# Patient Record
Sex: Male | Born: 1955 | Race: White | Hispanic: No | Smoking: Current every day smoker
Health system: Southern US, Community
[De-identification: ages and names within clinical notes are randomized; demographics above are authoritative.]

## PROBLEM LIST (undated history)

## (undated) DIAGNOSIS — R911 Solitary pulmonary nodule: Secondary | ICD-10-CM

## (undated) DIAGNOSIS — I714 Abdominal aortic aneurysm, without rupture, unspecified: Secondary | ICD-10-CM

## (undated) DIAGNOSIS — I10 Essential (primary) hypertension: Secondary | ICD-10-CM

## (undated) DIAGNOSIS — J439 Emphysema, unspecified: Secondary | ICD-10-CM

## (undated) DIAGNOSIS — G44009 Cluster headache syndrome, unspecified, not intractable: Secondary | ICD-10-CM

## (undated) HISTORY — DX: Solitary pulmonary nodule: R91.1

## (undated) HISTORY — PX: TONSILLECTOMY: SUR1361

## (undated) HISTORY — PX: HERNIA REPAIR: SHX51

## (undated) HISTORY — DX: Essential (primary) hypertension: I10

---

## 2010-03-21 ENCOUNTER — Ambulatory Visit: Payer: Self-pay | Admitting: Internal Medicine

## 2012-05-08 ENCOUNTER — Encounter (HOSPITAL_COMMUNITY): Payer: Self-pay | Admitting: Emergency Medicine

## 2012-05-08 ENCOUNTER — Emergency Department (HOSPITAL_COMMUNITY)
Admission: EM | Admit: 2012-05-08 | Discharge: 2012-05-08 | Disposition: A | Discharge status: Home / Self Care | Payer: Self-pay | Attending: Emergency Medicine | Admitting: Emergency Medicine

## 2012-05-08 ENCOUNTER — Emergency Department (HOSPITAL_COMMUNITY): Payer: Self-pay

## 2012-05-08 DIAGNOSIS — R109 Unspecified abdominal pain: Secondary | ICD-10-CM | POA: Insufficient documentation

## 2012-05-08 DIAGNOSIS — I714 Abdominal aortic aneurysm, without rupture: Secondary | ICD-10-CM | POA: Insufficient documentation

## 2012-05-08 DIAGNOSIS — I719 Aortic aneurysm of unspecified site, without rupture: Secondary | ICD-10-CM | POA: Insufficient documentation

## 2012-05-08 HISTORY — DX: Abdominal aortic aneurysm, without rupture: I71.4

## 2012-05-08 HISTORY — DX: Abdominal aortic aneurysm, without rupture, unspecified: I71.40

## 2012-05-08 LAB — COMPREHENSIVE METABOLIC PANEL
AST: 20 U/L (ref 0–37)
Albumin: 3.3 g/dL — ABNORMAL LOW (ref 3.5–5.2)
Alkaline Phosphatase: 92 U/L (ref 39–117)
BUN: 20 mg/dL (ref 6–23)
Chloride: 107 mEq/L (ref 96–112)
Potassium: 4.1 mEq/L (ref 3.5–5.1)
Total Bilirubin: 0.5 mg/dL (ref 0.3–1.2)
Total Protein: 6.8 g/dL (ref 6.0–8.3)

## 2012-05-08 LAB — CBC
HCT: 51 % (ref 39.0–52.0)
MCHC: 35.7 g/dL (ref 30.0–36.0)
Platelets: 179 10*3/uL (ref 150–400)
RDW: 13.8 % (ref 11.5–15.5)
WBC: 11.1 10*3/uL — ABNORMAL HIGH (ref 4.0–10.5)

## 2012-05-08 LAB — URINALYSIS, ROUTINE W REFLEX MICROSCOPIC
Glucose, UA: NEGATIVE mg/dL
Hgb urine dipstick: NEGATIVE
Ketones, ur: NEGATIVE mg/dL
Leukocytes, UA: NEGATIVE
pH: 6 (ref 5.0–8.0)

## 2012-05-08 MED ORDER — IOHEXOL 350 MG/ML SOLN
100.0000 mL | Freq: Once | INTRAVENOUS | Status: AC | PRN
  Administered 2012-05-08: 100 mL via INTRAVENOUS

## 2012-05-08 NOTE — ED Notes (Signed)
Pt.bloodpressure 142/104.report to nurse Shanda Bumps rn.

## 2012-05-08 NOTE — Discharge Instructions (Signed)
Abdominal Aortic Aneurysm  An aneurysm is the enlargement (dilatation), bulging, or ballooning out of part of the wall of a vein or artery. An aortic aneurysm is a bulging in the largest artery of the body. This artery supplies blood from the heart to the rest of the body.  The first part of the aorta is called the thoracic aorta. It leaves the heart, rises (ascends), arches, and goes down (descends) through the chest until it reaches the diaphragm. The diaphragm is the muscular part between the chest and abdomen.   The second part of the aorta is called the abdominal aorta after it has passed the diaphragm and continues down through the abdomen. The abdominal aorta ends where it splits to form the two iliac arteries that go to the legs.  Aortic aneurysms can develop anywhere along the length of the aorta. The majority are located along the abdominal aorta. The major concern with an aortic aneurysm is that it can enlarge and rupture. This can cause death unless diagnosed and treated promptly. Aneurysms can also develop blood clots or infections. CAUSES  Many aortic aneurysms are caused by arteriosclerosis. Arteriosclerosis can weaken the aortic wall. The pressure of the blood being pumped through the aorta causes it to balloon out at the site of weakness. Therefore, high blood pressure (hypertension) is associated with aneurysm. Other risk factors include:  Age over 13.   Tobacco use.   Being male.   White race.   Family history of aneurysm.   Less frequent causes of abdominal aortic aneurysms include:   Connective tissue diseases.   Abdominal trauma.   Inflammation of blood vessles (arteritis).   Inherited (congenital) malformations.   Infection.  SYMPTOMS  The signs and symptoms of an unruptured aneurysm will partly depend on its size and rate of growth.   Abdominal aortic aneurysms may cause pain. The pain typically has a deep quality as if it is piercing into the person. It is  felt most often in the lower back area. The pain is usually steady but may be relieved by changing your body position.   The person may also become aware of an abnormally prominent pulse in the belly (abdominal pulsation).  DIAGNOSIS  An aortic aneurysm may be discovered by chance on physical exam, or on X-ray studies done for other reasons. It may be suspected because of other problems such as back or abdominal pain. The following tests may help identify the problem.  X-rays of the abdomen can show calcium deposits in the aneurysm wall.   CT scanning of the abdomen, particularly with contrast medium, is accurate at showing the exact size and shape of the aneurysm.   Ultrasounds give a clear picture of the size of an aneurysm (about 98% accuracy).   MRI scanning is accurate, but often unnecessary.   An abdominal angiogram shows the source of the major blood vessels arising from the aorta. It reveals the size and extent of any aneurysm. It can also show a clot clinging to the wall of the aneurysm (mural thrombus).  TREATMENT  Treating an abdominal aortic aneurysm depends on the size. A rupture of an aneurysm is uncommon when they are less than 5 cm wide (2 inches). Rupture is far more common in aneurysms that are over 6 cm wide (2.4 inches).  Surgical repair is usually recommended for all aneurysms over 6 cm wide (2.4 inches). This depends on the health, age, and other circumstances of the individual. This type of surgery consists of  opening the abdomen, removing the aneurysm, and sewing a synthetic graft (similar to a cloth tube) in its place. A less invasive form of this surgery, using stent grafts, is sometimes recommended.   For most patients, elective repair is recommended for aneurysms between 4 and 6 cm (1.6 and 2.4 inches). Elective means the surgery can be done at your convenience. This should not be put off too long if surgery is recommended.   If you smoke, stop immediately. Smoking  is a major risk factor for enlargement and rupture.   Medications may be used to help decrease complications - these include medicine to lower blood pressure and control cholesterol.  HOME CARE INSTRUCTIONS   If you smoke, stop. Do not start smoking.   Take all medications as prescribed.   Your caregiver will tell you when to have your aneurysm rechecked, either by ultrasound or CT scan.   If your caregiver has given you a follow-up appointment, it is very important to keep that appointment. Not keeping the appointment could result in a chronic or permanent injury, pain, or disability. If there is any problem keeping the appointment, you must call back to this facility for assistance.  SEEK MEDICAL CARE IF:   You develop mild abdominal pain or pressure.   You are able to feel or perceive your aneurysm, and you sense any change.  SEEK IMMEDIATE MEDICAL CARE IF:   You develop severe abdominal pain, or severe pain moving (radiating) to your back.   You suddenly develop cold or blue toes or feet.   You suddenly develop lightheadedness or fainting spells.  MAKE SURE YOU:   Understand these instructions.   Will watch your condition.   Will get help right away if you are not doing well or get worse.  Document Released: 08/30/2005 Document Revised: 11/09/2011 Document Reviewed: 06/23/2008 [facility] Patient Information 2012 [place], [place].  RESOURCE GUIDE  Dental Problems  Patients with Medicaid: [facility] [id number]                                           [phone] [address] Phone:  [phone]                                                   Phone:  [phone]  If unable to pay or uninsured, contact:  [facility] or [facility]. to become qualified for the adult dental clinic.  Chronic Pain Problems Contact [facility]  [phone] Patients need to be referred by their primary care  doctor.  Insufficient Money for Medicine Contact [employer] Way:  call "211" or [facility] [phone].  No Primary Care Doctor Call [facility]  [phone] Other agencies that provide inexpensive medical care    [facility]  [phone]    [facility]  [phone]    [facility]  [phone]    [facility]  [phone]    [facility]  [phone]    [facility]  [phone]  Psychological Services [facility]  [phone] [facility]  Services  [phone] [facility]   [phone] (emergency services [phone])  Abuse/Neglect [facility] Child Abuse Hotline [phone] [facility] Child Abuse Hotline 3164897323 (After Hours)  Emergency Shelter [facility] [phone]  [facility] [place] [facility] [phone] [facility] [phone]  MRSA Hotline #:   812-212-6036    [facility] Resources  [facility] [place]  [employer]                           [facility] [address] [place]                     [address]         [address]  Blondell Reveal Phone:  [phone]                                  Phone:  [phone]                   Phone:  [phone]  [facility] Phone:  [phone]  [facility] Child Abuse Hotline [phone] (After Hours)

## 2012-05-08 NOTE — ED Notes (Signed)
Pt c/o intermittent lower abd pain x 3 weeks; pt sts has hx of abdominal aneurysm pt sts is homeless at present

## 2012-05-08 NOTE — ED Provider Notes (Signed)
History     CSN: [id number]  Arrival date & time 05/08/12  0903   None     Chief Complaint  Patient presents with  . Abdominal Pain    (Consider location/radiation/quality/duration/timing/severity/associated sxs/prior treatment) HPI  56yoM h/o AAA pw abdominal pain. Patient c/o intermittent b/l lower abd pain x 2 weeks, worse yesterday. Describes as sharp and crampy, 5/10. He had fleeting lower back pain this morning, resolved. Denies f/c/n/v/c/d. Denies hematuria/dysuria/freq/urgency. H/o 3.5cm AAA last checked 1 year ago OSH. Concerned about rupture. Does not have PMD. Homeless at this time. Denies other complaints. Declining pain medication at this time. No h/o abdominal surgeries  H/o R inguinal hernia repair as child   Past Medical History  Diagnosis Date  . Abdominal aneurysm     History reviewed. No pertinent past surgical history.  History reviewed. No pertinent family history.  History  Substance Use Topics  . Smoking status: Current Everyday Smoker  . Smokeless tobacco: Not on file  . Alcohol Use: No      Review of Systems  All other systems reviewed and are negative.   except as noted HPI   Allergies  Review of patient's allergies indicates no known allergies.  Home Medications  No current outpatient prescriptions on file.  BP 124/96  Pulse 62  Temp(Src) 97.5 F (36.4 C) (Oral)  Resp 18  SpO2 95%  Physical Exam  Nursing note and vitals reviewed. Constitutional: He is oriented to person, place, and time. He appears well-developed and well-nourished. No distress.  HENT:  Head: Atraumatic.  Mouth/Throat: Oropharynx is clear and moist.  Eyes: Conjunctivae are normal. Pupils are equal, round, and reactive to light.  Neck: Neck supple.  Cardiovascular: Normal rate, regular rhythm, normal heart sounds and intact distal pulses.  Exam reveals no gallop and no friction rub.   No murmur heard. Pulmonary/Chest: Effort normal. No respiratory  distress. He has no wheezes. He has no rales.  Abdominal: Soft. Bowel sounds are normal. There is no tenderness. There is no rebound and no guarding.  Musculoskeletal: Normal range of motion. He exhibits no edema and no tenderness.       Femoral pulses strong and equal b/l  Neurological: He is alert and oriented to person, place, and time.  Skin: Skin is warm and dry.  Psychiatric: He has a normal mood and affect.    ED Course  Procedures (including critical care time)  Labs Reviewed  CBC - Abnormal; Notable for the following:    WBC 11.1 (*)    Hemoglobin 18.2 (*)    All other components within normal limits  COMPREHENSIVE METABOLIC PANEL - Abnormal; Notable for the following:    Albumin 3.3 (*)    All other components within normal limits  URINALYSIS, ROUTINE W REFLEX MICROSCOPIC   Ct Angio Abd/pel W/ And/or W/o  05/08/2012  *RADIOLOGY REPORT*  Clinical Data:  No focal abdominal pain.  History of abdominal aortic aneurysm.  CT ANGIOGRAPHY ABDOMEN AND PELVIS  Technique:  Multidetector CT imaging of the abdomen and pelvis was performed using the standard protocol during bolus administration of intravenous contrast.  Multiplanar reconstructed images including MIPs were obtained and reviewed to evaluate the vascular anatomy.  Contrast: OMNIPAQUE IOHEXOL 350 MG/ML SOLN  Comparison:   None.  Findings:  There is mild diffuse atherosclerosis of the abdominal aorta and iliac arteries.  The infrarenal abdominal aorta has a maximal AP diameter of 3.4 cm.  There is mild associated mural thrombus.  There is  no evidence of dissection or retroperitoneal hemorrhage.  The celiac trunk, superior and inferior mesenteric arteries are patent.  There are single widely patent renal arteries bilaterally.  The lung bases are clear.  There is no pleural effusion.  The liver, gallbladder, biliary system and pancreas appear normal.  The spleen and adrenal glands appear normal.  The bowel gas pattern appears  normal.  No inflammatory changes or enlarged lymph nodes are identified. The appendix appears normal. The bladder and prostate gland appear normal.  No acute osseous findings are seen.   Review of the MIP images confirms the above findings.  IMPRESSION:  1.  Aortic atherosclerosis with small infrarenal abdominal aortic aneurysm.  No evidence of rupture or retroperitoneal hemorrhage. 2.  No acute abdominal pelvic findings.  Original Report Authenticated By: Gerrianne Scale, M.D.    1. Abdominal pain   2. Aortic aneurysm     MDM  Nonspecific abdominal pain, none currently. Labs and U/A unremarkable. CT angio without dissection Aneurysm at 3.4cm. Stable for discharge home. Given resources for PMD f/u. Precautions for return.  BP 124/96  Pulse 62  Temp(Src) 97.5 F (36.4 C) (Oral)  Resp 18  SpO2 95%         Forbes Cellar, MD 05/08/12 1307

## 2012-06-19 ENCOUNTER — Ambulatory Visit: Payer: Self-pay | Admitting: Family Medicine

## 2012-09-26 ENCOUNTER — Emergency Department: Payer: Self-pay | Admitting: Emergency Medicine

## 2012-09-26 LAB — URINALYSIS, COMPLETE
Glucose,UR: NEGATIVE mg/dL (ref 0–75)
Ketone: NEGATIVE
Nitrite: NEGATIVE
Ph: 5 (ref 4.5–8.0)
Protein: NEGATIVE
RBC,UR: 3 /HPF (ref 0–5)
Specific Gravity: 1.024 (ref 1.003–1.030)
Squamous Epithelial: NONE SEEN
WBC UR: <1 /HPF (ref 0–5)

## 2012-09-26 LAB — CBC
HCT: 52.1 % — ABNORMAL HIGH (ref 40.0–52.0)
MCH: 31.5 pg (ref 26.0–34.0)
MCV: 93 fL (ref 80–100)
RBC: 5.59 10*6/uL (ref 4.40–5.90)

## 2012-09-26 LAB — BASIC METABOLIC PANEL
Anion Gap: 8 (ref 7–16)
Calcium, Total: 8.5 mg/dL (ref 8.5–10.1)
Chloride: 111 mmol/L — ABNORMAL HIGH (ref 98–107)
Co2: 22 mmol/L (ref 21–32)
EGFR (African American): >60
Glucose: 88 mg/dL (ref 65–99)
Osmolality: 283 (ref 275–301)
Sodium: 141 mmol/L (ref 136–145)

## 2013-06-15 ENCOUNTER — Emergency Department (HOSPITAL_COMMUNITY): Payer: Self-pay

## 2013-06-15 ENCOUNTER — Encounter (HOSPITAL_COMMUNITY): Payer: Self-pay | Admitting: Emergency Medicine

## 2013-06-15 ENCOUNTER — Emergency Department (HOSPITAL_COMMUNITY)
Admission: EM | Admit: 2013-06-15 | Discharge: 2013-06-15 | Disposition: A | Discharge status: Home / Self Care | Payer: Self-pay | Attending: Emergency Medicine | Admitting: Emergency Medicine

## 2013-06-15 DIAGNOSIS — Z79899 Other long term (current) drug therapy: Secondary | ICD-10-CM | POA: Insufficient documentation

## 2013-06-15 DIAGNOSIS — R000 Tachycardia, unspecified: Secondary | ICD-10-CM | POA: Insufficient documentation

## 2013-06-15 DIAGNOSIS — R0989 Other specified symptoms and signs involving the circulatory and respiratory systems: Secondary | ICD-10-CM | POA: Insufficient documentation

## 2013-06-15 DIAGNOSIS — Z8679 Personal history of other diseases of the circulatory system: Secondary | ICD-10-CM | POA: Insufficient documentation

## 2013-06-15 DIAGNOSIS — R0789 Other chest pain: Secondary | ICD-10-CM | POA: Insufficient documentation

## 2013-06-15 DIAGNOSIS — F17200 Nicotine dependence, unspecified, uncomplicated: Secondary | ICD-10-CM | POA: Insufficient documentation

## 2013-06-15 DIAGNOSIS — R0609 Other forms of dyspnea: Secondary | ICD-10-CM | POA: Insufficient documentation

## 2013-06-15 DIAGNOSIS — Z5900 Homelessness unspecified: Secondary | ICD-10-CM | POA: Insufficient documentation

## 2013-06-15 DIAGNOSIS — R05 Cough: Secondary | ICD-10-CM | POA: Insufficient documentation

## 2013-06-15 DIAGNOSIS — R0600 Dyspnea, unspecified: Secondary | ICD-10-CM

## 2013-06-15 DIAGNOSIS — R059 Cough, unspecified: Secondary | ICD-10-CM | POA: Insufficient documentation

## 2013-06-15 LAB — CBC WITH DIFFERENTIAL/PLATELET
Eosinophils Relative: 2 % (ref 0–5)
HCT: 51.4 % (ref 39.0–52.0)
Hemoglobin: 18 g/dL — ABNORMAL HIGH (ref 13.0–17.0)
Lymphocytes Relative: 21 % (ref 12–46)
MCV: 89.7 fL (ref 78.0–100.0)
Monocytes Absolute: 1.4 10*3/uL — ABNORMAL HIGH (ref 0.1–1.0)
Monocytes Relative: 10 % (ref 3–12)
Neutro Abs: 9 10*3/uL — ABNORMAL HIGH (ref 1.7–7.7)
WBC: 13.5 10*3/uL — ABNORMAL HIGH (ref 4.0–10.5)

## 2013-06-15 LAB — COMPREHENSIVE METABOLIC PANEL
ALT: 15 U/L (ref 0–53)
AST: 20 U/L (ref 0–37)
Albumin: 3.4 g/dL — ABNORMAL LOW (ref 3.5–5.2)
Alkaline Phosphatase: 86 U/L (ref 39–117)
BUN: 19 mg/dL (ref 6–23)
CO2: 21 mEq/L (ref 19–32)
Calcium: 9.5 mg/dL (ref 8.4–10.5)
Chloride: 105 mEq/L (ref 96–112)
Creatinine, Ser: 0.73 mg/dL (ref 0.50–1.35)
Glucose, Bld: 79 mg/dL (ref 70–99)
Total Protein: 7.4 g/dL (ref 6.0–8.3)

## 2013-06-15 LAB — POCT I-STAT TROPONIN I

## 2013-06-15 MED ORDER — PREDNISONE 20 MG PO TABS
60.0000 mg | ORAL_TABLET | ORAL | Status: AC
  Administered 2013-06-15: 60 mg via ORAL
  Filled 2013-06-15: qty 3

## 2013-06-15 MED ORDER — ALBUTEROL SULFATE HFA 108 (90 BASE) MCG/ACT IN AERS
1.0000 | INHALATION_SPRAY | RESPIRATORY_TRACT | Status: DC
  Administered 2013-06-15: 2 via RESPIRATORY_TRACT
  Filled 2013-06-15: qty 6.7

## 2013-06-15 MED ORDER — PREDNISONE 20 MG PO TABS: 40.0000 mg | ORAL_TABLET | Freq: Every day | ORAL | Status: AC

## 2013-06-15 MED ORDER — IOHEXOL 350 MG/ML SOLN
100.0000 mL | Freq: Once | INTRAVENOUS | Status: AC | PRN
  Administered 2013-06-15: 100 mL via INTRAVENOUS

## 2013-06-15 NOTE — ED Provider Notes (Signed)
History    CSN: [id number] Arrival date & time 06/15/13  1246  First MD Initiated Contact with Patient 06/15/13 1503     Chief Complaint  Patient presents with  . Shortness of Breath   (Consider location/radiation/quality/duration/timing/severity/associated sxs/prior Treatment) HPI  Patient presents with concern of increasing dyspnea, lightheadedness. Symptoms have been minimally present until the last 3 days, when they have been increasingly severe. Patient now has exertional dyspnea, persistent dyspnea at rest, and associated diffuse anterior chest discomfort with inspiration. Patient also complains of exertional lightheadedness. He denies chest pain at rest. Patient denies fever, endorses cough. Patient has a long smoking history, was previously told he has an abnormal x-ray. He is homeless, has no access to primary care.   Past Medical History  Diagnosis Date  . Abdominal aneurysm    History reviewed. No pertinent past surgical history. No family history on file. History  Substance Use Topics  . Smoking status: Current Every Day Smoker  . Smokeless tobacco: Not on file  . Alcohol Use: No    Review of Systems  Constitutional:       Per HPI, otherwise negative  HENT:       Per HPI, otherwise negative  Respiratory:       Per HPI, otherwise negative  Cardiovascular:       Per HPI, otherwise negative  Gastrointestinal: Negative for vomiting.  Endocrine:       Negative aside from HPI  Genitourinary:       Neg aside from HPI   Musculoskeletal:       Per HPI, otherwise negative  Skin: Negative.   Neurological: Negative for syncope.    Allergies  Review of patient's allergies indicates no known allergies.  Home Medications   Current Outpatient Rx  Name  Route  Sig  Dispense  Refill  . lisinopril (PRINIVIL,ZESTRIL) 10 MG tablet   Oral   Take 10 mg by mouth daily.          BP 157/93  Pulse 120  Temp(Src) 98.3 F (36.8 C)  Resp 20  SpO2  95% Physical Exam  Nursing note and vitals reviewed. Constitutional: He is oriented to person, place, and time. He appears well-developed. No distress.  HENT:  Head: Normocephalic and atraumatic.  Eyes: Conjunctivae and EOM are normal.  Cardiovascular: Regular rhythm.  Tachycardia present.   Pulmonary/Chest: Effort normal. No stridor. No respiratory distress. He has decreased breath sounds.  Abdominal: He exhibits no distension.  Musculoskeletal: He exhibits no edema.  Neurological: He is alert and oriented to person, place, and time.  Skin: Skin is warm and dry.  Psychiatric: He has a normal mood and affect.    ED Course  Procedures (including critical care time) Labs Reviewed  CBC WITH DIFFERENTIAL  COMPREHENSIVE METABOLIC PANEL  PRO B NATRIURETIC PEPTIDE   Dg Chest 2 View  06/15/2013   *RADIOLOGY REPORT*  Clinical Data: Shortness of breath.  Lightheadedness.  COPD.  CHEST - 2 VIEW  Comparison: None.  Findings: Linear opacity is seen in the lingula, which may be due to atelectasis or scarring.  No evidence of pulmonary air space disease or edema.  No evidence of pleural effusion.  Heart size is normal.  No mass or lymphadenopathy identified.  IMPRESSION: Lingular atelectasis versus scarring.   Original Report Authenticated By: Myles Rosenthal, M.D.   No diagnosis found.  His initial chest x-ray has opacification in the lingula, and given the patient's tachycardia, his dyspnea, CT scan was performed.  Pulse ox 99% with nasal cannula abnormal Cardiac 110 sinus tach abnormal   Date: 06/15/2013  Rate: 111  Rhythm: sinus tachycardia  QRS Axis: left  Intervals: normal  ST/T Wave abnormalities: nonspecific ST/T changes  Conduction Disutrbances:left anterior fascicular block  Narrative Interpretation:   Old EKG Reviewed: none available ABNORMAL  7:44 PM In no distress - HD stable.  MDM  Patient with a strong smoking history now presents with worsening dyspnea, including  exertional discomfort and pleuritic discomfort.  Patient's evaluation here included CT scan, and there is no evidence of pneumonia, but there is suspicion of chronic lung disease.  The patient is homeless, and was provided resources to obtain assistance with health care, as well as referral to pulmonary medicine.   Gerhard Munch, MD 06/15/13 1946

## 2013-06-15 NOTE — ED Notes (Signed)
Pt from home c/o SOB, lightheaded. Pt states that he was told approx 4 mths ago that he had suspicious chest x ray. Pt is a current smoker. Pt has productive cough with tan sputum. Pt denies CP or any pain, just discomfort with breathing. Pt is A&O and in NAD. Pt adds that walking or physical exertion makes breathing worse. Pt O2 sats 97% on RA.

## 2013-06-20 ENCOUNTER — Ambulatory Visit (INDEPENDENT_AMBULATORY_CARE_PROVIDER_SITE_OTHER): Payer: Self-pay | Admitting: Internal Medicine

## 2013-06-20 ENCOUNTER — Encounter: Payer: Self-pay | Admitting: Internal Medicine

## 2013-06-20 VITALS — BP 130/80 | HR 72 | Temp 98.2°F | Ht 71.0 in | Wt 248.0 lb

## 2013-06-20 DIAGNOSIS — R059 Cough, unspecified: Secondary | ICD-10-CM

## 2013-06-20 DIAGNOSIS — R05 Cough: Secondary | ICD-10-CM

## 2013-06-20 DIAGNOSIS — R0989 Other specified symptoms and signs involving the circulatory and respiratory systems: Secondary | ICD-10-CM

## 2013-06-20 DIAGNOSIS — R0600 Dyspnea, unspecified: Secondary | ICD-10-CM

## 2013-06-20 DIAGNOSIS — R0609 Other forms of dyspnea: Secondary | ICD-10-CM

## 2013-06-20 DIAGNOSIS — I10 Essential (primary) hypertension: Secondary | ICD-10-CM

## 2013-06-20 DIAGNOSIS — F17200 Nicotine dependence, unspecified, uncomplicated: Secondary | ICD-10-CM

## 2013-06-20 MED ORDER — OLMESARTAN MEDOXOMIL-HCTZ 20-12.5 MG PO TABS: ORAL_TABLET | ORAL | Status: DC

## 2013-06-20 NOTE — Patient Instructions (Addendum)
Stop lisinopril  Start benicar 20/12.5  mg one half daily   The key is to stop smoking completely before smoking completely stops you!   Only use your albuterol as a rescue medication to be used if you can't catch your breath by resting or doing a relaxed purse lip breathing pattern. The less you use it, the better it will work when you need it. Ok to use  up to 2puffs every 4 hours if needed   If you are satisfied with your treatment plan let your doctor know and he/she can either refill your medications or you can return here when your prescription runs out.     If in any way you are not 100% satisfied,  please tell us.  If 100% better, tell your friends!

## 2013-06-20 NOTE — Progress Notes (Signed)
  Subjective:    Patient ID: Cameron Armstrong, male    DOB: [birth year]  MRN: [id number]  HPI  37 yowm uninsured homeless  Smoker dx'd copd in [place] 2011 rx albuterol and rec stop smoking with variable sob since that point referred to pulmonary clinic 06/20/2013 p er eval for sob   06/20/2013 1st pulmonary eval/ Jona Zappone/ still smoking on ACEi cc sob daily last time breathing was good was sev months prior to OV  In [place] and not on ACEi at that time but at [facility]/co assoc cough > mid prod mucoid sputum cough worse lying down some better on prednisone and some better on albuterol and still sob on inclines.   Not much better on albuterol.  No obvious daytime variabilty or assoc chronic cough or cp or chest tightness, subjective wheeze overt sinus or hb symptoms. No unusual exp hx or h/o childhood pna/ asthma or knowledge of premature birth.      Review of Systems  Constitutional: Negative for fever, chills, activity change, appetite change and unexpected weight change.  HENT: Negative for congestion, sore throat, rhinorrhea, sneezing, trouble swallowing, dental problem, voice change and postnasal drip.   Eyes: Negative for visual disturbance.  Respiratory: Positive for cough and shortness of breath. Negative for choking.   Cardiovascular: Negative for chest pain and leg swelling.  Gastrointestinal: Negative for nausea, vomiting and abdominal pain.  Genitourinary: Negative for difficulty urinating.  Musculoskeletal: Negative for arthralgias.  Skin: Positive for rash.  Psychiatric/Behavioral: Negative for behavioral problems and confusion.       Objective:   Physical Exam  amb slt hoarse wm nad Wt Readings from Last 3 Encounters:  06/20/13 248 lb (112.492 kg)      .HEENT: nl dentition, turbinates, and orophanx. Nl external ear canals without cough reflex   NECK :  without JVD/Nodes/TM/ nl carotid upstrokes bilaterally   LUNGS: no acc muscle use, clear to A and P bilaterally without cough on  insp or exp maneuvers   CV:  RRR  no s3 or murmur or increase in P2, no edema   ABD:  soft and nontender with nl excursion in the supine position. No bruits or organomegaly, bowel sounds nl  MS:  warm without deformities, calf tenderness, cyanosis or clubbing  SKIN: warm and dry without lesions    NEURO:  alert, approp, no deficits      Ct 06/15/13 . Negative for pulmonary embolism.  2. Emphysema, which may account for mild hilar lymph node  enlargement       Assessment & Plan:

## 2013-06-21 DIAGNOSIS — I10 Essential (primary) hypertension: Secondary | ICD-10-CM | POA: Insufficient documentation

## 2013-06-21 DIAGNOSIS — F17200 Nicotine dependence, unspecified, uncomplicated: Secondary | ICD-10-CM | POA: Insufficient documentation

## 2013-06-21 DIAGNOSIS — R059 Cough, unspecified: Secondary | ICD-10-CM | POA: Insufficient documentation

## 2013-06-21 DIAGNOSIS — R05 Cough: Secondary | ICD-10-CM | POA: Insufficient documentation

## 2013-06-21 NOTE — Assessment & Plan Note (Signed)
I reviewed the Flethcher curve with patient that basically indicates  if you quit smoking when your best day FEV1 is still well preserved (which is clearly is)  it is highly unlikely you will progress to severe disease and informed the patient there was no medication on the market that has proven to change the curve or the likelihood of progression.  Therefore stopping smoking and maintaining abstinence is the most important aspect of care, not choice of inhalers or for that matter, doctors.    Pulmonary f/u is prn

## 2013-06-21 NOTE — Assessment & Plan Note (Signed)
Changed to benicar 20/12.5 one half daily x 3 month samples as he has no funds or insurance.

## 2013-06-21 NOTE — Assessment & Plan Note (Signed)
Despite smoking hx the dx is not copd but rather  Classic Upper airway cough syndrome, so named because it's frequently impossible to sort out how much is  CR/sinusitis with freq throat clearing (which can be related to primary GERD)   vs  causing  secondary (" extra esophageal")  GERD from wide swings in gastric pressure that occur with throat clearing, often  promoting self use of mint and menthol lozenges that reduce the lower esophageal sphincter tone and exacerbate the problem further in a cyclical fashion.   These are the same pts (now being labeled as having "irritable larynx syndrome" by some cough centers) who not infrequently have a history of having failed to tolerate ace inhibitors,  dry powder inhalers or biphosphonates or report having atypical reflux symptoms that don't respond to standard doses of PPI , and are easily confused as having aecopd or asthma flares by even experienced allergists/ pulmonologists.   For now try off acei and regroup prn  Smoking cessation also strongly rec

## 2014-02-16 ENCOUNTER — Encounter (HOSPITAL_COMMUNITY): Payer: Self-pay | Admitting: Emergency Medicine

## 2014-02-16 ENCOUNTER — Emergency Department (HOSPITAL_COMMUNITY): Payer: Self-pay

## 2014-02-16 ENCOUNTER — Emergency Department (HOSPITAL_COMMUNITY)
Admission: EM | Admit: 2014-02-16 | Discharge: 2014-02-16 | Disposition: A | Discharge status: Home / Self Care | Payer: Self-pay | Attending: Emergency Medicine | Admitting: Emergency Medicine

## 2014-02-16 DIAGNOSIS — F17200 Nicotine dependence, unspecified, uncomplicated: Secondary | ICD-10-CM | POA: Insufficient documentation

## 2014-02-16 DIAGNOSIS — Z7982 Long term (current) use of aspirin: Secondary | ICD-10-CM | POA: Insufficient documentation

## 2014-02-16 DIAGNOSIS — R519 Headache, unspecified: Secondary | ICD-10-CM

## 2014-02-16 DIAGNOSIS — R51 Headache: Secondary | ICD-10-CM | POA: Insufficient documentation

## 2014-02-16 DIAGNOSIS — I10 Essential (primary) hypertension: Secondary | ICD-10-CM | POA: Insufficient documentation

## 2014-02-16 MED ORDER — KETOROLAC TROMETHAMINE 30 MG/ML IJ SOLN
30.0000 mg | Freq: Once | INTRAMUSCULAR | Status: AC
  Administered 2014-02-16: 30 mg via INTRAVENOUS
  Filled 2014-02-16: qty 1

## 2014-02-16 MED ORDER — PROCHLORPERAZINE EDISYLATE 5 MG/ML IJ SOLN: 10.0000 mg | Freq: Four times a day (QID) | INTRAMUSCULAR | Status: DC | PRN

## 2014-02-16 MED ORDER — DIPHENHYDRAMINE HCL 50 MG/ML IJ SOLN
25.0000 mg | Freq: Once | INTRAMUSCULAR | Status: AC
  Administered 2014-02-16: 25 mg via INTRAVENOUS
  Filled 2014-02-16: qty 1

## 2014-02-16 MED ORDER — SODIUM CHLORIDE 0.9 % IV BOLUS (SEPSIS)
1000.0000 mL | Freq: Once | INTRAVENOUS | Status: AC
  Administered 2014-02-16: 1000 mL via INTRAVENOUS

## 2014-02-16 MED ORDER — LORAZEPAM 1 MG PO TABS
1.0000 mg | ORAL_TABLET | Freq: Once | ORAL | Status: AC
  Administered 2014-02-16: 1 mg via ORAL
  Filled 2014-02-16: qty 1

## 2014-02-16 MED ORDER — PROCHLORPERAZINE EDISYLATE 5 MG/ML IJ SOLN
10.0000 mg | Freq: Once | INTRAMUSCULAR | Status: AC
  Administered 2014-02-16: 10 mg via INTRAVENOUS
  Filled 2014-02-16: qty 2

## 2014-02-16 NOTE — ED Notes (Signed)
Pt presents to ed with c/o headache for weeks. Sts since yesterday it has become unbearable. PT sts years ago he was told he has a brain bleed but never checked it out? (Sts doctor's told him he needs to have camera put in his brain to look at it because it can't been seen on CT scan)  Pt denies n/v.

## 2014-02-16 NOTE — Discharge Instructions (Signed)
Return here as needed.  Follow-up with the neurologist provided °

## 2014-02-16 NOTE — Progress Notes (Signed)
[facility] provided pt with a list of primary care resources, highlighting [facility]. Provided patient with a list of shelters in [place].

## 2014-02-16 NOTE — ED Provider Notes (Signed)
CSN: [id number]     Arrival date & time 02/16/14  1252 History   First MD Initiated Contact with Patient 02/16/14 1505     Chief Complaint  Patient presents with  . Headache     (Consider location/radiation/quality/duration/timing/severity/associated sxs/prior Treatment) HPI Patient presents to the emergency department with headache, intermittently for the last week.  The patient, states, that previous headaches, that are similar to this in the past.  Patient, states, that he is told that he has cluster migraines.  Patient, states he never followed up on these headaches.  The patient, states, that they will come intermittently with a searing pain behind his eyes.  Patient denies blurred vision, weakness, dizziness, neck pain, nausea, vomiting, diarrhea, chest pain, shortness of breath, or syncope.  Patient, states, that nothing seems to help his headache.  Past Medical History  Diagnosis Date  . Abdominal aneurysm   . Pulmonary nodule   . Hypertension    History reviewed. No pertinent past surgical history. Family History  Problem Relation Age of Onset  . Heart attack Father    History  Substance Use Topics  . Smoking status: Current Every Day Smoker -- 1.50 packs/day for 37 years    Types: Cigarettes  . Smokeless tobacco: Never Used  . Alcohol Use: No    Review of Systems All other systems negative except as documented in the HPI. All pertinent positives and negatives as reviewed in the HPI.   Allergies  Review of patient's allergies indicates no known allergies.  Home Medications   Current Outpatient Rx  Name  Route  Sig  Dispense  Refill  . acetaminophen (TYLENOL) 325 MG tablet   Oral   Take 650 mg by mouth every 6 (six) hours as needed for headache.         Marland Kitchen aspirin 325 MG EC tablet   Oral   Take 1,300 mg by mouth as needed for pain (headache).         . diphenhydrAMINE (BENADRYL) 25 mg capsule   Oral   Take 25 mg by mouth every 6 (six) hours as needed for  allergies.         . naproxen sodium (ANAPROX) 220 MG tablet   Oral   Take 440-660 mg by mouth as needed (headache).          BP 138/86  Pulse 78  Temp(Src) 97.9 F (36.6 C) (Oral)  Resp 18  SpO2 95% Physical Exam  Nursing note and vitals reviewed. Constitutional: He is oriented to person, place, and time. He appears well-developed and well-nourished. No distress.  HENT:  Head: Normocephalic and atraumatic.  Eyes: EOM are normal. Pupils are equal, round, and reactive to light.  Neck: Normal range of motion. Neck supple.  Cardiovascular: Normal rate, regular rhythm and normal heart sounds.  Exam reveals no gallop and no friction rub.   No murmur heard. Pulmonary/Chest: Effort normal and breath sounds normal. No respiratory distress.  Neurological: He is alert and oriented to person, place, and time. He exhibits normal muscle tone. Coordination normal.  Skin: Skin is warm and dry.    ED Course  Procedures (including critical care time) Imaging Review Ct Head Wo Contrast  02/16/2014   CLINICAL DATA:  Headaches  EXAM: CT HEAD WITHOUT CONTRAST  TECHNIQUE: Contiguous axial images were obtained from the base of the skull through the vertex without intravenous contrast.  COMPARISON:  None.  FINDINGS: There is no evidence of mass effect, midline shift or extra-axial fluid  collections. There is no evidence of a space-occupying lesion or intracranial hemorrhage. There is no evidence of a cortical-based area of acute infarction.  The ventricles and sulci are appropriate for the patient's age. The basal cisterns are patent.  Visualized portions of the orbits are unremarkable. The visualized portions of the paranasal sinuses and mastoid air cells are unremarkable.  The osseous structures are unremarkable.  IMPRESSION: Normal CT of the brain.   Electronically Signed   By: Elige KoHetal  Patel   On: 02/16/2014 16:14     Patient will be referred to neurology.  Told to return here as needed.  Also  advised the patient to increase his fluid intake.  CT scan was reviewed and is negative  Carlyle DollyChristopher W Juni Glaab, PA-C 02/16/14 1827

## 2014-02-16 NOTE — ED Notes (Signed)
Patient transported to CT 

## 2014-02-17 NOTE — ED Provider Notes (Signed)
Medical screening examination/treatment/procedure(s) were performed by non-physician practitioner and as supervising physician I was immediately available for consultation/collaboration.   EKG Interpretation None       Gilda Creasehristopher J. Pollina, MD 02/17/14 [phone]

## 2014-02-24 ENCOUNTER — Ambulatory Visit: Payer: Self-pay | Admitting: Neurology

## 2014-03-11 ENCOUNTER — Ambulatory Visit: Payer: Self-pay | Admitting: Neurology

## 2014-03-27 ENCOUNTER — Ambulatory Visit: Payer: Self-pay | Admitting: Neurology

## 2014-05-10 ENCOUNTER — Emergency Department (HOSPITAL_COMMUNITY)
Admission: EM | Admit: 2014-05-10 | Discharge: 2014-05-10 | Disposition: A | Discharge status: Home / Self Care | Payer: Self-pay | Attending: Emergency Medicine | Admitting: Emergency Medicine

## 2014-05-10 ENCOUNTER — Encounter (HOSPITAL_COMMUNITY): Payer: Self-pay | Admitting: Emergency Medicine

## 2014-05-10 DIAGNOSIS — Z23 Encounter for immunization: Secondary | ICD-10-CM | POA: Insufficient documentation

## 2014-05-10 DIAGNOSIS — L02619 Cutaneous abscess of unspecified foot: Secondary | ICD-10-CM | POA: Insufficient documentation

## 2014-05-10 DIAGNOSIS — Z79899 Other long term (current) drug therapy: Secondary | ICD-10-CM | POA: Insufficient documentation

## 2014-05-10 DIAGNOSIS — F17200 Nicotine dependence, unspecified, uncomplicated: Secondary | ICD-10-CM | POA: Insufficient documentation

## 2014-05-10 DIAGNOSIS — J4489 Other specified chronic obstructive pulmonary disease: Secondary | ICD-10-CM | POA: Insufficient documentation

## 2014-05-10 DIAGNOSIS — I10 Essential (primary) hypertension: Secondary | ICD-10-CM | POA: Insufficient documentation

## 2014-05-10 DIAGNOSIS — L600 Ingrowing nail: Secondary | ICD-10-CM | POA: Insufficient documentation

## 2014-05-10 DIAGNOSIS — J449 Chronic obstructive pulmonary disease, unspecified: Secondary | ICD-10-CM | POA: Insufficient documentation

## 2014-05-10 DIAGNOSIS — L03039 Cellulitis of unspecified toe: Secondary | ICD-10-CM | POA: Insufficient documentation

## 2014-05-10 MED ORDER — SULFAMETHOXAZOLE-TRIMETHOPRIM 800-160 MG PO TABS: 1.0000 | ORAL_TABLET | Freq: Two times a day (BID) | ORAL | Status: AC

## 2014-05-10 MED ORDER — TETANUS-DIPHTH-ACELL PERTUSSIS 5-2.5-18.5 LF-MCG/0.5 IM SUSP
0.5000 mL | Freq: Once | INTRAMUSCULAR | Status: AC
  Administered 2014-05-10: 0.5 mL via INTRAMUSCULAR
  Filled 2014-05-10: qty 0.5

## 2014-05-10 MED ORDER — SULFAMETHOXAZOLE-TMP DS 800-160 MG PO TABS
1.0000 | ORAL_TABLET | Freq: Once | ORAL | Status: AC
  Administered 2014-05-10: 1 via ORAL
  Filled 2014-05-10: qty 1

## 2014-05-10 NOTE — ED Notes (Signed)
Pt. reports left great toe pain for 3 weeks , denies injury .

## 2014-05-10 NOTE — ED Notes (Signed)
Pt desatted to 87%. When this RN entered the pt's room, she found that he was sleeping. This RN placed the pt on 2L of O2 as a precaution.

## 2014-05-10 NOTE — Discharge Instructions (Signed)
Infected Ingrown Toenail  An infected ingrown toenail occurs when the nail edge grows into the skin and bacteria invade the area. Symptoms include pain, tenderness, swelling, and pus drainage from the edge of the nail. Poorly fitting shoes, minor injuries, and improper cutting of the toenail may also contribute to the problem. You should cut your toenails squarely instead of rounding the edges. Do not cut them too short. Avoid tight or pointed toe shoes. Sometimes the ingrown portion of the nail must be removed. If your toenail is removed, it can take 3-4 months for it to re-grow.  HOME CARE INSTRUCTIONS    Soak your infected toe in warm water for 20-30 minutes, 2 to 3 times a day.   Packing or dressings applied to the area should be changed daily.   Take medicine as directed and finish them.   Reduce activities and keep your foot elevated when able to reduce swelling and discomfort. Do this until the infection gets better.   Wear sandals or go barefoot as much as possible while the infected area is sensitive.   See your caregiver for follow-up care in 2-3 days if the infection is not better.  SEEK MEDICAL CARE IF:   Your toe is becoming more red, swollen or painful.  MAKE SURE YOU:    Understand these instructions.   Will watch your condition.   Will get help right away if you are not doing well or get worse.  Document Released: 12/28/2004 Document Revised: 02/12/2012 Document Reviewed: 11/16/2008  [employer] Patient Information 2014 [employer].

## 2014-05-10 NOTE — ED Provider Notes (Addendum)
CSN: [id number]     Arrival date & time 05/10/14  0006 History   First MD Initiated Contact with Patient 05/10/14 0209     Chief Complaint  Patient presents with  . Toe Pain     (Consider location/radiation/quality/duration/timing/severity/associated sxs/prior Treatment) HPI 58 yo man with HTN, PVD and COPD. Here with complaints of left great toe pain. Patient does not cut his toe nails. He has developed an ingrown toenail over lateral aspect of large toe. Has mild ttp and mild throbbing pain which is non radiating. No fever. No proximal pain. Nothing makes pain worse. Last td is unknown.   Past Medical History  Diagnosis Date  . Abdominal aneurysm   . Pulmonary nodule   . Hypertension    History reviewed. No pertinent past surgical history. Family History  Problem Relation Age of Onset  . Heart attack Father    History  Substance Use Topics  . Smoking status: Current Every Day Smoker -- 1.50 packs/day for 37 years    Types: Cigarettes  . Smokeless tobacco: Never Used  . Alcohol Use: No    Review of Systems  Ten point review of symptoms performed and is negative with the exception of symptoms noted above.    Allergies  Review of patient's allergies indicates no known allergies.  Home Medications   Prior to Admission medications   Medication Sig Start Date End Date Taking? Authorizing Provider  amLODipine (NORVASC) 10 MG tablet Take 10 mg by mouth daily.   Yes Historical Provider, MD  cloNIDine (CATAPRES) 0.1 MG tablet Take 0.1 mg by mouth daily.   Yes Historical Provider, MD  hydrochlorothiazide (HYDRODIURIL) 25 MG tablet Take 25 mg by mouth daily.   Yes Historical Provider, MD  simvastatin (ZOCOR) 20 MG tablet Take 20 mg by mouth daily.   Yes Historical Provider, MD   BP 106/72  Pulse 76  Temp(Src) 97.6 F (36.4 C) (Oral)  Resp 25  Ht 5\' 11"  (1.803 m)  Wt 234 lb (106.142 kg)  BMI 32.65 kg/m2  SpO2 88% Physical Exam Gen: well developed and well nourished  appearing Head: NCAT Eyes: PERL, EOMI Nose: no epistaixis or rhinorrhea Mouth/throat: mucosa is moist and pink, poor dentition Lungs: CTA B, no wheezing, rhonchi or rales CV: RRR, no murmur, extremities appear well perfused.  Abd:  nondistended Skin: warm and dry Ext: normal to inspection, no dependent edema, very thick and long toenails x 10, left great toe nail is overgrown, has curled back and and caused abrasion of the skin lateral to the nail bed, there is some mild erythema, mild ttp, no proximal streaking, No swelling of digit.  Neuro: CN ii-xii grossly intact, no focal deficits Psyche; normal affect,  calm and cooperative.   ED Course  Procedures (including critical care time) Labs Review  MDM   Ingrown toenail.  Patient needs nail care from a podiatrist. We will refer to Dr. Alita Chyle.  Will update Td and prescribe course of Bactrim.  Patient to see Podiatry tomorrow. Counseled re: return precautions.     Brandt Loosen, MD 05/10/14 5885  Brandt Loosen, MD 05/24/14 [phone]

## 2014-12-31 ENCOUNTER — Emergency Department (HOSPITAL_COMMUNITY)
Admission: EM | Admit: 2014-12-31 | Discharge: 2014-12-31 | Disposition: A | Discharge status: Home / Self Care | Payer: Self-pay | Attending: Emergency Medicine | Admitting: Emergency Medicine

## 2014-12-31 ENCOUNTER — Encounter (HOSPITAL_COMMUNITY): Payer: Self-pay

## 2014-12-31 DIAGNOSIS — I714 Abdominal aortic aneurysm, without rupture, unspecified: Secondary | ICD-10-CM

## 2014-12-31 DIAGNOSIS — Z720 Tobacco use: Secondary | ICD-10-CM | POA: Insufficient documentation

## 2014-12-31 DIAGNOSIS — Z590 Homelessness: Secondary | ICD-10-CM | POA: Insufficient documentation

## 2014-12-31 DIAGNOSIS — Z79899 Other long term (current) drug therapy: Secondary | ICD-10-CM | POA: Insufficient documentation

## 2014-12-31 DIAGNOSIS — I10 Essential (primary) hypertension: Secondary | ICD-10-CM | POA: Insufficient documentation

## 2014-12-31 LAB — BASIC METABOLIC PANEL
Anion gap: 9 (ref 5–15)
BUN: 15 mg/dL (ref 6–23)
CO2: 24 mmol/L (ref 19–32)
CREATININE: 1.03 mg/dL (ref 0.50–1.35)
Calcium: 9.1 mg/dL (ref 8.4–10.5)
Chloride: 106 mmol/L (ref 96–112)
GFR calc Af Amer: >90 mL/min (ref >90)
GFR calc non Af Amer: 79 mL/min — ABNORMAL LOW (ref >90)
Glucose, Bld: 85 mg/dL (ref 70–99)
POTASSIUM: 3.7 mmol/L (ref 3.5–5.1)
Sodium: 139 mmol/L (ref 135–145)

## 2014-12-31 LAB — CBC
HEMATOCRIT: 50.6 % (ref 39.0–52.0)
Hemoglobin: 17.9 g/dL — ABNORMAL HIGH (ref 13.0–17.0)
MCH: 31.9 pg (ref 26.0–34.0)
MCHC: 35.4 g/dL (ref 30.0–36.0)
MCV: 90 fL (ref 78.0–100.0)
Platelets: 198 10*3/uL (ref 150–400)
RBC: 5.62 MIL/uL (ref 4.22–5.81)
RDW: 13.7 % (ref 11.5–15.5)
WBC: 13.6 10*3/uL — ABNORMAL HIGH (ref 4.0–10.5)

## 2014-12-31 NOTE — Discharge Instructions (Signed)
[facility].   You need to get repeat US in a year to monitor aneurysm.   Return to ER if you have severe abdominal pain, fever, vomiting.

## 2014-12-31 NOTE — ED Provider Notes (Signed)
CSN: [id number]     Arrival date & time 12/31/14  2051 History   First MD Initiated Contact with Patient 12/31/14 2210     Chief Complaint  Patient presents with  . Aneurysm     (Consider location/radiation/quality/duration/timing/severity/associated sxs/prior Treatment) The history is provided by the patient.  Cameron Armstrong is a 59 y.o. male hx of aneurysm, HTN here with here with checkup for aortic aneurysm. He is currently homeless and hasn't been following up with his aortic aneurysm for the last 2 years. About 2 years ago he states that his aneurysm is about 3.7 cm. He has been having intermittent abdominal pain that is going on for the last month. Denies any fever or chills or vomiting. Denies any urinary symptoms. He is currently homeless and just wants to get checked out.    Past Medical History  Diagnosis Date  . Abdominal aneurysm   . Pulmonary nodule   . Hypertension    History reviewed. No pertinent past surgical history. Family History  Problem Relation Age of Onset  . Heart attack Father    History  Substance Use Topics  . Smoking status: Current Every Day Smoker -- 1.50 packs/day for 37 years    Types: Cigarettes  . Smokeless tobacco: Never Used  . Alcohol Use: No    Review of Systems  Gastrointestinal: Positive for abdominal pain.  All other systems reviewed and are negative.     Allergies  Review of patient's allergies indicates no known allergies.  Home Medications   Prior to Admission medications   Medication Sig Start Date End Date Taking? Authorizing Provider  amLODipine (NORVASC) 10 MG tablet Take 10 mg by mouth daily.    Historical Provider, MD  cloNIDine (CATAPRES) 0.1 MG tablet Take 0.1 mg by mouth daily.    Historical Provider, MD  hydrochlorothiazide (HYDRODIURIL) 25 MG tablet Take 25 mg by mouth daily.    Historical Provider, MD  simvastatin (ZOCOR) 20 MG tablet Take 20 mg by mouth daily.    Historical Provider, MD   BP 129/87 mmHg   Pulse 91  Temp(Src) 97.9 F (36.6 C) (Oral)  Resp 22  SpO2 95% Physical Exam  Constitutional: He is oriented to person, place, and time. He appears well-developed and well-nourished.  HENT:  Head: Normocephalic.  Mouth/Throat: Oropharynx is clear and moist.  Eyes: Pupils are equal, round, and reactive to light.  Neck: Normal range of motion. Neck supple.  Cardiovascular: Normal rate, regular rhythm and normal heart sounds.   Pulmonary/Chest: Effort normal and breath sounds normal. No respiratory distress. He has no wheezes. He has no rales.  Abdominal: Soft. Bowel sounds are normal. He exhibits no distension. There is no tenderness. There is no rebound and no guarding.  Musculoskeletal: Normal range of motion. He exhibits no edema or tenderness.  Neurological: He is alert and oriented to person, place, and time. No cranial nerve deficit. Coordination normal.  Skin: Skin is warm and dry.  Psychiatric: He has a normal mood and affect. His behavior is normal. Judgment and thought content normal.  Nursing note and vitals reviewed.   ED Course  Procedures (including critical care time)  EMERGENCY DEPARTMENT [place] ABD/AORTA EXAM Study: Limited Ultrasound of the Abdominal Aorta.  INDICATIONS:Abdominal pain Indication: Multiple views of the abdominal aorta are obtained from the diaphragmatic hiatus to the aortic bifurcation in transverse and sagittal planes with a multi- Frequency probe.  PERFORMED BY: Myself  IMAGES ARCHIVED?: Yes  FINDINGS: Maximum aortic dimensions are 3.7  cm   LIMITATIONS:  Body habitus  INTERPRETATION:  Stable AAA   COMMENT:     Labs Review Labs Reviewed  CBC - Abnormal; Notable for the following:    WBC 13.6 (*)    Hemoglobin 17.9 (*)    All other components within normal limits  BASIC METABOLIC PANEL - Abnormal; Notable for the following:    GFR calc non Af Amer 79 (*)    All other components within normal limits    Imaging Review No results  found.   EKG Interpretation   Date/Time:  Thursday December 31 2014 20:58:30 EST Ventricular Rate:  99 PR Interval:  180 QRS Duration: 92 QT Interval:  388 QTC Calculation: 497 R Axis:   -54 Text Interpretation:  Normal sinus rhythm Left anterior fascicular block  Anterolateral infarct , age undetermined Abnormal ECG No significant  change since last tracing Confirmed by YAO  MD, DAVID ([id number]) on 12/31/2014  10:11:02 PM      MDM   Final diagnoses:  None    Cameron Armstrong is a 59 y.o. male here with vague abdominal pain for the last month. Bedside US showed AAA 3.7, that is stable from previous. Vitals stable. Abdomen nontender. WBC 13 but was 13 2 years ago. No signs of infection. No signs of AAA rupture. I told him to find PMD and get regular follow up in a year. Return if he has vomiting, fever, severe abdominal pain.     Richardean Canalavid H Yao, MD 12/31/14 [phone]

## 2014-12-31 NOTE — ED Notes (Signed)
Pt here for checkup on abdominal aneurysm.  Sts that it has been 2 years since he last got it checked out due to not having a doctor here.  Pt reporting no pain or shortness of breath.

## 2015-11-03 ENCOUNTER — Ambulatory Visit: Payer: Self-pay | Attending: Family Medicine

## 2015-12-15 ENCOUNTER — Emergency Department (HOSPITAL_COMMUNITY)
Admission: EM | Admit: 2015-12-15 | Discharge: 2015-12-16 | Disposition: A | Discharge status: Home / Self Care | Payer: Self-pay | Attending: Emergency Medicine | Admitting: Emergency Medicine

## 2015-12-15 ENCOUNTER — Encounter (HOSPITAL_COMMUNITY): Payer: Self-pay | Admitting: Emergency Medicine

## 2015-12-15 DIAGNOSIS — I10 Essential (primary) hypertension: Secondary | ICD-10-CM | POA: Insufficient documentation

## 2015-12-15 DIAGNOSIS — R519 Headache, unspecified: Secondary | ICD-10-CM

## 2015-12-15 DIAGNOSIS — R51 Headache: Secondary | ICD-10-CM | POA: Insufficient documentation

## 2015-12-15 DIAGNOSIS — J439 Emphysema, unspecified: Secondary | ICD-10-CM | POA: Insufficient documentation

## 2015-12-15 DIAGNOSIS — Z79899 Other long term (current) drug therapy: Secondary | ICD-10-CM | POA: Insufficient documentation

## 2015-12-15 DIAGNOSIS — F17210 Nicotine dependence, cigarettes, uncomplicated: Secondary | ICD-10-CM | POA: Insufficient documentation

## 2015-12-15 HISTORY — DX: Cluster headache syndrome, unspecified, not intractable: G44.009

## 2015-12-15 HISTORY — DX: Emphysema, unspecified: J43.9

## 2015-12-15 NOTE — ED Notes (Signed)
Pt states he has a hx of cluster migraines  Pt states he has pain across the front of his forehead  Pt states the pain started around 4am this morning  Pt states the pain fluctuates in intensity  Pt states it is like a inner burning pain  Pt states he has taken aspirin and has taken benadryl 3 separate times today  Pt states he is very tired

## 2015-12-16 MED ORDER — KETOROLAC TROMETHAMINE 60 MG/2ML IM SOLN
60.0000 mg | Freq: Once | INTRAMUSCULAR | Status: AC
  Administered 2015-12-16: 60 mg via INTRAMUSCULAR
  Filled 2015-12-16: qty 2

## 2015-12-16 MED ORDER — PROCHLORPERAZINE EDISYLATE 5 MG/ML IJ SOLN
10.0000 mg | Freq: Once | INTRAMUSCULAR | Status: AC
  Administered 2015-12-16: 10 mg via INTRAMUSCULAR
  Filled 2015-12-16: qty 2

## 2015-12-16 NOTE — Discharge Instructions (Signed)
Follow-up with your primary care physician. °Return here for any new or worsening symptoms. °

## 2015-12-16 NOTE — ED Provider Notes (Signed)
CSN: [id number]     Arrival date & time 12/15/15  2047 History   First MD Initiated Contact with Patient 12/16/15 0255     Chief Complaint  Patient presents with  . Headache     (Consider location/radiation/quality/duration/timing/severity/associated sxs/prior Treatment) Patient is a 60 y.o. male presenting with headaches. The history is provided by the patient and medical records.  Headache   59 y.o. M with hx of AAA, pulmonary nodules, HTN, cluster headaches, emphysema, presenting to the ED for headache.  Patient states headache began 4 days ago, has been progressively worsening.  Patient states headache occurs across his forehead, throbbing in nature. He denies any photophobia, phonophobia, dizziness, aura, visual disturbance, changes in speech, numbness, or weakness. No difficulty ambulating. Patient does have history of cluster headaches, states this is similar but less severe than some of his headaches in the past. He has taken aspirin and Benadryl 3 times a day without relief. He states now he just feels sleepy from all of Benadryl. He denies any fever, chills, or neck pain.  Patient not currently on anti-coagulation.  No recent head injury or falls.  Patient states he has not seen a neurologist for his headaches in several years. PCP-- Mee Hives  Past Medical History  Diagnosis Date  . Abdominal aneurysm (HCC)   . Pulmonary nodule   . Hypertension   . Migraine-cluster headache syndrome   . Emphysema of lung [facility])    Past Surgical History  Procedure Laterality Date  . Hernia repair    . Tonsillectomy     Family History  Problem Relation Age of Onset  . Heart attack Father    Social History  Substance Use Topics  . Smoking status: Current Every Day Smoker -- 1.50 packs/day for 37 years    Types: Cigarettes  . Smokeless tobacco: Never Used  . Alcohol Use: No    Review of Systems  Neurological: Positive for headaches.  All other systems reviewed and are  negative.     Allergies  Review of patient's allergies indicates no known allergies.  Home Medications   Prior to Admission medications   Medication Sig Start Date End Date Taking? Authorizing Provider  albuterol (PROVENTIL HFA;VENTOLIN HFA) 108 (90 Base) MCG/ACT inhaler Inhale 1-2 puffs into the lungs every 6 (six) hours as needed for wheezing or shortness of breath.   Yes Historical Provider, MD  amLODipine (NORVASC) 10 MG tablet Take 10 mg by mouth daily.   Yes Historical Provider, MD  cloNIDine (CATAPRES) 0.1 MG tablet Take 0.1 mg by mouth daily.   Yes Historical Provider, MD  hydrochlorothiazide (HYDRODIURIL) 25 MG tablet Take 25 mg by mouth daily.   Yes Historical Provider, MD  simvastatin (ZOCOR) 20 MG tablet Take 20 mg by mouth daily.   Yes Historical Provider, MD   BP 145/93 mmHg  Pulse 85  Temp(Src) 97.5 F (36.4 C) (Oral)  Resp 20  SpO2 96%   Physical Exam  Constitutional: He is oriented to person, place, and time. He appears well-developed and well-nourished. No distress.  HENT:  Head: Normocephalic and atraumatic.  Mouth/Throat: Oropharynx is clear and moist.  Eyes: Conjunctivae and EOM are normal. Pupils are equal, round, and reactive to light.  Pupils symmetric and reactive bilaterally  Neck: Normal range of motion and full passive range of motion without pain. Neck supple. No spinous process tenderness and no muscular tenderness present. No rigidity.  Full ROM, no meningismus  Cardiovascular: Normal rate, regular rhythm and normal heart sounds.  Pulmonary/Chest: Effort normal and breath sounds normal. No respiratory distress. He has no wheezes.  Musculoskeletal: Normal range of motion. He exhibits no edema.  Neurological: He is alert and oriented to person, place, and time.  AAOx3, answering questions appropriately; equal strength UE and LE bilaterally; CN grossly intact; moves all extremities appropriately without ataxia; no focal neuro deficits or facial  asymmetry appreciated  Skin: Skin is warm and dry. He is not diaphoretic.  Psychiatric: He has a normal mood and affect.  Nursing note and vitals reviewed.   ED Course  Procedures (including critical care time) Labs Review Labs Reviewed - No data to display  Imaging Review No results found. I have personally reviewed and evaluated these images and lab results as part of my medical decision-making.   EKG Interpretation None      MDM   Final diagnoses:  Headache, unspecified headache type   60 year old male here with headache for the past 4 days. His symptoms have been progressively worsening. Patient afebrile, nontoxic. Patient has no clinical signs of meningitis. His neurologic exam is nonfocal. He does have history of cluster migraines.  No response to aspirin or benadryl at home.  Will give toradol and compazine.  Will reassess.  VS stable currently.  5:31 AM Patient now states he is feeling better, headache fully resolved.  Remains neurologically intact.  VSS.  Will d/c home.  Encouraged to follow-up with PCP.  Discussed plan with patient, he/she acknowledged understanding and agreed with plan of care.  Return precautions given for new or worsening symptoms.  Garlon HatchetLisa M Shaneil Yazdi, PA-C 12/16/15 [id number]  April Palumbo, MD 12/16/15 [phone]

## 2015-12-16 NOTE — ED Notes (Signed)
Pt states he had a cold for about 3 or 4 days and now had a sinus headache. Pt states he took aspirin and benadryl for this with no relief. Pt states that over the last hour, he has started to feel a little better.  Pt refused to change into gown for assessment.

## 2016-01-18 ENCOUNTER — Ambulatory Visit (INDEPENDENT_AMBULATORY_CARE_PROVIDER_SITE_OTHER): Payer: Self-pay | Admitting: Family Medicine

## 2016-01-18 ENCOUNTER — Encounter: Payer: Self-pay | Admitting: Family Medicine

## 2016-01-18 VITALS — BP 145/83 | HR 90 | Temp 97.8°F | Resp 18 | Ht 71.0 in | Wt 241.0 lb

## 2016-01-18 DIAGNOSIS — I10 Essential (primary) hypertension: Secondary | ICD-10-CM

## 2016-01-18 DIAGNOSIS — Z23 Encounter for immunization: Secondary | ICD-10-CM

## 2016-01-18 DIAGNOSIS — F17200 Nicotine dependence, unspecified, uncomplicated: Secondary | ICD-10-CM

## 2016-01-18 DIAGNOSIS — K029 Dental caries, unspecified: Secondary | ICD-10-CM

## 2016-01-18 DIAGNOSIS — E669 Obesity, unspecified: Secondary | ICD-10-CM

## 2016-01-18 DIAGNOSIS — H6121 Impacted cerumen, right ear: Secondary | ICD-10-CM

## 2016-01-18 DIAGNOSIS — K08409 Partial loss of teeth, unspecified cause, unspecified class: Secondary | ICD-10-CM

## 2016-01-18 DIAGNOSIS — E785 Hyperlipidemia, unspecified: Secondary | ICD-10-CM

## 2016-01-18 DIAGNOSIS — Z720 Tobacco use: Secondary | ICD-10-CM

## 2016-01-18 LAB — COMPLETE METABOLIC PANEL WITH GFR
ALT: 21 U/L (ref 9–46)
AST: 20 U/L (ref 10–35)
Albumin: 3.7 g/dL (ref 3.6–5.1)
Alkaline Phosphatase: 78 U/L (ref 40–115)
BUN: 23 mg/dL (ref 7–25)
CHLORIDE: 103 mmol/L (ref 98–110)
CO2: 26 mmol/L (ref 20–31)
CREATININE: 0.93 mg/dL (ref 0.70–1.33)
Calcium: 9.6 mg/dL (ref 8.6–10.3)
GFR, Est Non African American: >89 mL/min (ref >=60)
Glucose, Bld: 82 mg/dL (ref 65–99)
Potassium: 3.6 mmol/L (ref 3.5–5.3)
Sodium: 137 mmol/L (ref 135–146)
Total Bilirubin: 0.4 mg/dL (ref 0.2–1.2)
Total Protein: 6.7 g/dL (ref 6.1–8.1)

## 2016-01-18 LAB — POCT URINALYSIS DIP (DEVICE)
BILIRUBIN URINE: NEGATIVE
Glucose, UA: NEGATIVE mg/dL
HGB URINE DIPSTICK: NEGATIVE
KETONES UR: NEGATIVE mg/dL
LEUKOCYTES UA: NEGATIVE
Nitrite: NEGATIVE
Protein, ur: NEGATIVE mg/dL
Specific Gravity, Urine: 1.015 (ref 1.005–1.030)
Urobilinogen, UA: 0.2 mg/dL (ref 0.0–1.0)
pH: 5.5 (ref 5.0–8.0)

## 2016-01-18 LAB — CBC WITH DIFFERENTIAL/PLATELET
Basophils Absolute: 0.1 10*3/uL (ref 0.0–0.1)
Basophils Relative: 1 % (ref 0–1)
EOS PCT: 2 % (ref 0–5)
Eosinophils Absolute: 0.2 10*3/uL (ref 0.0–0.7)
HCT: 55.7 % — ABNORMAL HIGH (ref 39.0–52.0)
HEMOGLOBIN: 19.4 g/dL — AB (ref 13.0–17.0)
LYMPHS ABS: 3.1 10*3/uL (ref 0.7–4.0)
Lymphocytes Relative: 26 % (ref 12–46)
MCH: 31.4 pg (ref 26.0–34.0)
MCHC: 34.5 g/dL (ref 30.0–36.0)
MCV: 90.3 fL (ref 78.0–100.0)
MPV: 11.7 fL (ref 8.6–12.4)
Monocytes Absolute: 1.1 10*3/uL — ABNORMAL HIGH (ref 0.1–1.0)
Monocytes Relative: 9 % (ref 3–12)
NEUTROS PCT: 62 % (ref 43–77)
Neutro Abs: 7.4 10*3/uL (ref 1.7–7.7)
Platelets: 189 10*3/uL (ref 150–400)
RBC: 6.17 MIL/uL — ABNORMAL HIGH (ref 4.22–5.81)
RDW: 13.5 % (ref 11.5–15.5)
WBC: 11.9 10*3/uL — ABNORMAL HIGH (ref 4.0–10.5)

## 2016-01-18 LAB — HEMOGLOBIN A1C
Hgb A1c MFr Bld: 5.4 % (ref <5.7)
MEAN PLASMA GLUCOSE: 108 mg/dL (ref <117)

## 2016-01-18 MED ORDER — HYDROCHLOROTHIAZIDE 25 MG PO TABS: 25.0000 mg | ORAL_TABLET | Freq: Every day | ORAL | Status: DC

## 2016-01-18 MED ORDER — SIMVASTATIN 20 MG PO TABS: 20.0000 mg | ORAL_TABLET | Freq: Every day | ORAL | Status: DC

## 2016-01-18 MED ORDER — AMLODIPINE BESYLATE 10 MG PO TABS: 10.0000 mg | ORAL_TABLET | Freq: Every day | ORAL | Status: DC

## 2016-01-18 MED ORDER — CLONIDINE HCL 0.1 MG PO TABS: 0.1000 mg | ORAL_TABLET | Freq: Every day | ORAL | Status: DC

## 2016-01-18 NOTE — Patient Instructions (Addendum)
Sent referral to a dental clinic  Recommend smoking cessation DASH Eating Plan DASH stands for "Dietary Approaches to Stop Hypertension." The DASH eating plan is a healthy eating plan that has been shown to reduce high blood pressure (hypertension). Additional health benefits may include reducing the risk of type 2 diabetes mellitus, heart disease, and stroke. The DASH eating plan may also help with weight loss. WHAT DO I NEED TO KNOW ABOUT THE DASH EATING PLAN? For the DASH eating plan, you will follow these general guidelines:  Choose foods with a percent daily value for sodium of less than 5% (as listed on the food label).  Use salt-free seasonings or herbs instead of table salt or sea salt.  Check with your health care provider or pharmacist before using salt substitutes.  Eat lower-sodium products, often labeled as "lower sodium" or "no salt added."  Eat fresh foods.  Eat more vegetables, fruits, and low-fat dairy products.  Choose whole grains. Look for the word "whole" as the first word in the ingredient list.  Choose fish and skinless chicken or [place] more often than red meat. Limit fish, poultry, and meat to 6 oz (170 g) each day.  Limit sweets, desserts, sugars, and sugary drinks.  Choose heart-healthy fats.  Limit cheese to 1 oz (28 g) per day.  Eat more home-cooked food and less restaurant, buffet, and fast food.  Limit fried foods.  Cook foods using methods other than frying.  Limit canned vegetables. If you do use them, rinse them well to decrease the sodium.  When eating at a restaurant, ask that your food be prepared with less salt, or no salt if possible. WHAT FOODS CAN I EAT? Seek help from a dietitian for individual calorie needs. Grains Whole grain or whole wheat bread. Brown rice. Whole grain or whole wheat pasta. Quinoa, bulgur, and whole grain cereals. Low-sodium cereals. Corn or whole wheat flour tortillas. Whole grain cornbread. Whole grain crackers.  Low-sodium crackers. Vegetables Fresh or frozen vegetables (raw, steamed, roasted, or grilled). Low-sodium or reduced-sodium tomato and vegetable juices. Low-sodium or reduced-sodium tomato sauce and paste. Low-sodium or reduced-sodium canned vegetables.  Fruits All fresh, canned (in natural juice), or frozen fruits. Meat and Other Protein Products Ground beef (85% or leaner), grass-fed beef, or beef trimmed of fat. Skinless chicken or [place]. Ground chicken or [place]. Pork trimmed of fat. All fish and seafood. Eggs. Dried beans, peas, or lentils. Unsalted nuts and seeds. Unsalted canned beans. Dairy Low-fat dairy products, such as skim or 1% milk, 2% or reduced-fat cheeses, low-fat ricotta or cottage cheese, or plain low-fat yogurt. Low-sodium or reduced-sodium cheeses. Fats and Oils Tub margarines without trans fats. Light or reduced-fat mayonnaise and salad dressings (reduced sodium). Avocado. Safflower, olive, or canola oils. Natural peanut or almond butter. Other Unsalted popcorn and pretzels. The items listed above may not be a complete list of recommended foods or beverages. Contact your dietitian for more options. WHAT FOODS ARE NOT RECOMMENDED? Grains White bread. White pasta. White rice. Refined cornbread. Bagels and croissants. Crackers that contain trans fat. Vegetables Creamed or fried vegetables. Vegetables in a cheese sauce. Regular canned vegetables. Regular canned tomato sauce and paste. Regular tomato and vegetable juices. Fruits Dried fruits. Canned fruit in light or heavy syrup. Fruit juice. Meat and Other Protein Products Fatty cuts of meat. Ribs, chicken wings, bacon, sausage, bologna, salami, chitterlings, fatback, hot dogs, bratwurst, and packaged luncheon meats. Salted nuts and seeds. Canned beans with salt. Dairy Whole or 2% milk,  cream, half-and-half, and cream cheese. Whole-fat or sweetened yogurt. Full-fat cheeses or blue cheese. Nondairy creamers and whipped  toppings. Processed cheese, cheese spreads, or cheese curds. Condiments Onion and garlic salt, seasoned salt, table salt, and sea salt. Canned and packaged gravies. Worcestershire sauce. Tartar sauce. Barbecue sauce. Teriyaki sauce. Soy sauce, including reduced sodium. Steak sauce. Fish sauce. Oyster sauce. Cocktail sauce. Horseradish. Ketchup and mustard. Meat flavorings and tenderizers. Bouillon cubes. Hot sauce. Tabasco sauce. Marinades. Taco seasonings. Relishes. Fats and Oils Butter, stick margarine, lard, shortening, ghee, and bacon fat. Coconut, palm kernel, or palm oils. Regular salad dressings. Other Pickles and olives. Salted popcorn and pretzels. The items listed above may not be a complete list of foods and beverages to avoid. Contact your dietitian for more information. WHERE CAN I FIND MORE INFORMATION? [employer], [employer]: [employer]   This information is not intended to replace advice given to you by your health care provider. Make sure you discuss any questions you have with your health care provider.   Document Released: 11/09/2011 Document Revised: 12/11/2014 Document Reviewed: 09/24/2013 Elsevier Interactive Patient Education 2016 [employer] Fat and Cholesterol Restricted Diet High levels of fat and cholesterol in your blood may lead to various health problems, such as diseases of the heart, blood vessels, gallbladder, liver, and pancreas. Fats are concentrated sources of energy that come in various forms. Certain types of fat, including saturated fat, may be harmful in excess. Cholesterol is a substance needed by your body in small amounts. Your body makes all the cholesterol it needs. Excess cholesterol comes from the food you eat. When you have high levels of cholesterol and saturated fat in your blood, health problems can develop because the excess fat and cholesterol will gather along the walls of your blood  vessels, causing them to narrow. Choosing the right foods will help you control your intake of fat and cholesterol. This will help keep the levels of these substances in your blood within normal limits and reduce your risk of disease. WHAT IS MY PLAN? Your health care provider recommends that you:  Get no more than __________ % of the total calories in your daily diet from fat.  Limit your intake of saturated fat to less than ______% of your total calories each day.  Limit the amount of cholesterol in your diet to less than _________mg per day. WHAT TYPES OF FAT SHOULD I CHOOSE?  Choose healthy fats more often. Choose monounsaturated and polyunsaturated fats, such as olive and canola oil, flaxseeds, walnuts, almonds, and seeds.  Eat more omega-3 fats. Good choices include salmon, mackerel, sardines, tuna, flaxseed oil, and ground flaxseeds. Aim to eat fish at least two times a week.  Limit saturated fats. Saturated fats are primarily found in animal products, such as meats, butter, and cream. Plant sources of saturated fats include palm oil, palm kernel oil, and coconut oil.  Avoid foods with partially hydrogenated oils in them. These contain trans fats. Examples of foods that contain trans fats are stick margarine, some tub margarines, cookies, crackers, and other baked goods. WHAT GENERAL GUIDELINES DO I NEED TO FOLLOW? These guidelines for healthy eating will help you control your intake of fat and cholesterol:  Check food labels carefully to identify foods with trans fats or high amounts of saturated fat.  Fill one half of your plate with vegetables and green salads.  Fill one fourth of your plate with whole grains. Look for the word "whole" as the first word  in the ingredient list.  Fill one fourth of your plate with lean protein foods.  Limit fruit to two servings a day. Choose fruit instead of juice.  Eat more foods that contain soluble fiber. Examples of foods that contain this  type of fiber are apples, broccoli, carrots, beans, peas, and barley. Aim to get 20-30 g of fiber per day.  Eat more home-cooked food and less restaurant, buffet, and fast food.  Limit or avoid alcohol.  Limit foods high in starch and sugar.  Limit fried foods.  Cook foods using methods other than frying. Baking, boiling, grilling, and broiling are all great options.  Lose weight if you are overweight. Losing just 5-10% of your initial body weight can help your overall health and prevent diseases such as diabetes and heart disease. WHAT FOODS CAN I EAT? Grains Whole grains, such as whole wheat or whole grain breads, crackers, cereals, and pasta. Unsweetened oatmeal, bulgur, barley, quinoa, or brown rice. Corn or whole wheat flour tortillas. Vegetables Fresh or frozen vegetables (raw, steamed, roasted, or grilled). Green salads. Fruits All fresh, canned (in natural juice), or frozen fruits. Meat and Other Protein Products Ground beef (85% or leaner), grass-fed beef, or beef trimmed of fat. Skinless chicken or [place]. Ground chicken or [place]. Pork trimmed of fat. All fish and seafood. Eggs. Dried beans, peas, or lentils. Unsalted nuts or seeds. Unsalted canned or dry beans. Dairy Low-fat dairy products, such as skim or 1% milk, 2% or reduced-fat cheeses, low-fat ricotta or cottage cheese, or plain low-fat yogurt. Fats and Oils Tub margarines without trans fats. Light or reduced-fat mayonnaise and salad dressings. Avocado. Olive, canola, sesame, or safflower oils. Natural peanut or almond butter (choose ones without added sugar and oil). The items listed above may not be a complete list of recommended foods or beverages. Contact your dietitian for more options. WHAT FOODS ARE NOT RECOMMENDED? Grains White bread. White pasta. White rice. Cornbread. Bagels, pastries, and croissants. Crackers that contain trans fat. Vegetables White potatoes. Corn. Creamed or fried vegetables. Vegetables in  a cheese sauce. Fruits Dried fruits. Canned fruit in light or heavy syrup. Fruit juice. Meat and Other Protein Products Fatty cuts of meat. Ribs, chicken wings, bacon, sausage, bologna, salami, chitterlings, fatback, hot dogs, bratwurst, and packaged luncheon meats. Liver and organ meats. Dairy Whole or 2% milk, cream, half-and-half, and cream cheese. Whole milk cheeses. Whole-fat or sweetened yogurt. Full-fat cheeses. Nondairy creamers and whipped toppings. Processed cheese, cheese spreads, or cheese curds. Sweets and Desserts Corn syrup, sugars, honey, and molasses. Candy. Jam and jelly. Syrup. Sweetened cereals. Cookies, pies, cakes, donuts, muffins, and ice cream. Fats and Oils Butter, stick margarine, lard, shortening, ghee, or bacon fat. Coconut, palm kernel, or palm oils. Beverages Alcohol. Sweetened drinks (such as sodas, lemonade, and fruit drinks or punches). The items listed above may not be a complete list of foods and beverages to avoid. Contact your dietitian for more information.   This information is not intended to replace advice given to you by your health care provider. Make sure you discuss any questions you have with your health care provider.   Document Released: 11/20/2005 Document Revised: 12/11/2014 Document Reviewed: 02/18/2014 Elsevier Interactive Patient Education 2016 [employer] Fat and Cholesterol Restricted Diet High levels of fat and cholesterol in your blood may lead to various health problems, such as diseases of the heart, blood vessels, gallbladder, liver, and pancreas. Fats are concentrated sources of energy that come in various forms. Certain types of  fat, including saturated fat, may be harmful in excess. Cholesterol is a substance needed by your body in small amounts. Your body makes all the cholesterol it needs. Excess cholesterol comes from the food you eat. When you have high levels of cholesterol and saturated fat in your blood, health problems can  develop because the excess fat and cholesterol will gather along the walls of your blood vessels, causing them to narrow. Choosing the right foods will help you control your intake of fat and cholesterol. This will help keep the levels of these substances in your blood within normal limits and reduce your risk of disease. WHAT IS MY PLAN? Your health care provider recommends that you:  Get no more than __________ % of the total calories in your daily diet from fat.  Limit your intake of saturated fat to less than ______% of your total calories each day.  Limit the amount of cholesterol in your diet to less than _________mg per day. WHAT TYPES OF FAT SHOULD I CHOOSE?  Choose healthy fats more often. Choose monounsaturated and polyunsaturated fats, such as olive and canola oil, flaxseeds, walnuts, almonds, and seeds.  Eat more omega-3 fats. Good choices include salmon, mackerel, sardines, tuna, flaxseed oil, and ground flaxseeds. Aim to eat fish at least two times a week.  Limit saturated fats. Saturated fats are primarily found in animal products, such as meats, butter, and cream. Plant sources of saturated fats include palm oil, palm kernel oil, and coconut oil.  Avoid foods with partially hydrogenated oils in them. These contain trans fats. Examples of foods that contain trans fats are stick margarine, some tub margarines, cookies, crackers, and other baked goods. WHAT GENERAL GUIDELINES DO I NEED TO FOLLOW? These guidelines for healthy eating will help you control your intake of fat and cholesterol:  Check food labels carefully to identify foods with trans fats or high amounts of saturated fat.  Fill one half of your plate with vegetables and green salads.  Fill one fourth of your plate with whole grains. Look for the word "whole" as the first word in the ingredient list.  Fill one fourth of your plate with lean protein foods.  Limit fruit to two servings a day. Choose fruit instead of  juice.  Eat more foods that contain soluble fiber. Examples of foods that contain this type of fiber are apples, broccoli, carrots, beans, peas, and barley. Aim to get 20-30 g of fiber per day.  Eat more home-cooked food and less restaurant, buffet, and fast food.  Limit or avoid alcohol.  Limit foods high in starch and sugar.  Limit fried foods.  Cook foods using methods other than frying. Baking, boiling, grilling, and broiling are all great options.  Lose weight if you are overweight. Losing just 5-10% of your initial body weight can help your overall health and prevent diseases such as diabetes and heart disease. WHAT FOODS CAN I EAT? Grains Whole grains, such as whole wheat or whole grain breads, crackers, cereals, and pasta. Unsweetened oatmeal, bulgur, barley, quinoa, or brown rice. Corn or whole wheat flour tortillas. Vegetables Fresh or frozen vegetables (raw, steamed, roasted, or grilled). Green salads. Fruits All fresh, canned (in natural juice), or frozen fruits. Meat and Other Protein Products Ground beef (85% or leaner), grass-fed beef, or beef trimmed of fat. Skinless chicken or [place]. Ground chicken or [place]. Pork trimmed of fat. All fish and seafood. Eggs. Dried beans, peas, or lentils. Unsalted nuts or seeds. Unsalted canned or dry  beans. Dairy Low-fat dairy products, such as skim or 1% milk, 2% or reduced-fat cheeses, low-fat ricotta or cottage cheese, or plain low-fat yogurt. Fats and Oils Tub margarines without trans fats. Light or reduced-fat mayonnaise and salad dressings. Avocado. Olive, canola, sesame, or safflower oils. Natural peanut or almond butter (choose ones without added sugar and oil). The items listed above may not be a complete list of recommended foods or beverages. Contact your dietitian for more options. WHAT FOODS ARE NOT RECOMMENDED? Grains White bread. White pasta. White rice. Cornbread. Bagels, pastries, and croissants. Crackers that contain  trans fat. Vegetables White potatoes. Corn. Creamed or fried vegetables. Vegetables in a cheese sauce. Fruits Dried fruits. Canned fruit in light or heavy syrup. Fruit juice. Meat and Other Protein Products Fatty cuts of meat. Ribs, chicken wings, bacon, sausage, bologna, salami, chitterlings, fatback, hot dogs, bratwurst, and packaged luncheon meats. Liver and organ meats. Dairy Whole or 2% milk, cream, half-and-half, and cream cheese. Whole milk cheeses. Whole-fat or sweetened yogurt. Full-fat cheeses. Nondairy creamers and whipped toppings. Processed cheese, cheese spreads, or cheese curds. Sweets and Desserts Corn syrup, sugars, honey, and molasses. Candy. Jam and jelly. Syrup. Sweetened cereals. Cookies, pies, cakes, donuts, muffins, and ice cream. Fats and Oils Butter, stick margarine, lard, shortening, ghee, or bacon fat. Coconut, palm kernel, or palm oils. Beverages Alcohol. Sweetened drinks (such as sodas, lemonade, and fruit drinks or punches). The items listed above may not be a complete list of foods and beverages to avoid. Contact your dietitian for more information.   This information is not intended to replace advice given to you by your health care provider. Make sure you discuss any questions you have with your health care provider.   Document Released: 11/20/2005 Document Revised: 12/11/2014 Document Reviewed: 02/18/2014 Elsevier Interactive Patient Education 2016 [employer] Fat and Cholesterol Restricted Diet Getting too much fat and cholesterol in your diet may cause health problems. Following this diet helps keep your fat and cholesterol at normal levels. This can keep you from getting sick. WHAT TYPES OF FAT SHOULD I CHOOSE?  Choose monosaturated and polyunsaturated fats. These are found in foods such as olive oil, canola oil, flaxseeds, walnuts, almonds, and seeds.  Eat more omega-3 fats. Good choices include salmon, mackerel, sardines, tuna, flaxseed oil, and  ground flaxseeds.  Limit saturated fats. These are in animal products such as meats, butter, and cream. They can also be in plant products such as palm oil, palm kernel oil, and coconut oil.   Avoid foods with partially hydrogenated oils in them. These contain trans fats. Examples of foods that have trans fats are stick margarine, some tub margarines, cookies, crackers, and other baked goods. WHAT GENERAL GUIDELINES DO I NEED TO FOLLOW?   Check food labels. Look for the words "trans fat" and "saturated fat."  When preparing a meal:  Fill half of your plate with vegetables and green salads.  Fill one fourth of your plate with whole grains. Look for the word "whole" as the first word in the ingredient list.  Fill one fourth of your plate with lean protein foods.  Limit fruit to two servings a day. Choose fruit instead of juice.  Eat more foods with soluble fiber. Examples of foods with this type of fiber are apples, broccoli, carrots, beans, peas, and barley. Try to get 20-30 g (grams) of fiber per day.  Eat more home-cooked foods. Eat less at restaurants and buffets.  Limit or avoid alcohol.  Limit foods high  in starch and sugar.  Limit fried foods.  Cook foods without frying them. Baking, boiling, grilling, and broiling are all great options.  Lose weight if you are overweight. Losing even a small amount of weight can help your overall health. It can also help prevent diseases such as diabetes and heart disease. WHAT FOODS CAN I EAT? Grains Whole grains, such as whole wheat or whole grain breads, crackers, cereals, and pasta. Unsweetened oatmeal, bulgur, barley, quinoa, or brown rice. Corn or whole wheat flour tortillas. Vegetables Fresh or frozen vegetables (raw, steamed, roasted, or grilled). Green salads. Fruits All fresh, canned (in natural juice), or frozen fruits. Meat and Other Protein Products Ground beef (85% or leaner), grass-fed beef, or beef trimmed of fat.  Skinless chicken or [place]. Ground chicken or [place]. Pork trimmed of fat. All fish and seafood. Eggs. Dried beans, peas, or lentils. Unsalted nuts or seeds. Unsalted canned or dry beans. Dairy Low-fat dairy products, such as skim or 1% milk, 2% or reduced-fat cheeses, low-fat ricotta or cottage cheese, or plain low-fat yogurt. Fats and Oils Tub margarines without trans fats. Light or reduced-fat mayonnaise and salad dressings. Avocado. Olive, canola, sesame, or safflower oils. Natural peanut or almond butter (choose ones without added sugar and oil). The items listed above may not be a complete list of recommended foods or beverages. Contact your dietitian for more options. WHAT FOODS ARE NOT RECOMMENDED? Grains White bread. White pasta. White rice. Cornbread. Bagels, pastries, and croissants. Crackers that contain trans fat. Vegetables White potatoes. Corn. Creamed or fried vegetables. Vegetables in a cheese sauce. Fruits Dried fruits. Canned fruit in light or heavy syrup. Fruit juice. Meat and Other Protein Products Fatty cuts of meat. Ribs, chicken wings, bacon, sausage, bologna, salami, chitterlings, fatback, hot dogs, bratwurst, and packaged luncheon meats. Liver and organ meats. Dairy Whole or 2% milk, cream, half-and-half, and cream cheese. Whole milk cheeses. Whole-fat or sweetened yogurt. Full-fat cheeses. Nondairy creamers and whipped toppings. Processed cheese, cheese spreads, or cheese curds. Sweets and Desserts Corn syrup, sugars, honey, and molasses. Candy. Jam and jelly. Syrup. Sweetened cereals. Cookies, pies, cakes, donuts, muffins, and ice cream. Fats and Oils Butter, stick margarine, lard, shortening, ghee, or bacon fat. Coconut, palm kernel, or palm oils. Beverages Alcohol. Sweetened drinks (such as sodas, lemonade, and fruit drinks or punches). The items listed above may not be a complete list of foods and beverages to avoid. Contact your dietitian for more information.    This information is not intended to replace advice given to you by your health care provider. Make sure you discuss any questions you have with your health care provider.   Document Released: 05/21/2012 Document Revised: 12/11/2014 Document Reviewed: 02/19/2014 [employer] Interactive Patient Education [employer]

## 2016-01-18 NOTE — Progress Notes (Signed)
Subjective:    Patient ID: Cameron Armstrong, male    DOB: 03-Sep-1956, 60 y.o.   MRN: [id number]  HPI Cameron Armstrong, a 60 year old male with a history of hypertension and hyperlipidemia that presents to establish care. He states that he was a patient at [facility] prior to establishing care. He states that it has been a number of years since he's had a primary provider.  Patient is here for evaluation of hypertension and hyperlipidemia.  Patient denies chest pain, dyspnea, fatigue, irregular heart beat, orthopnea, palpitations, syncope and tachypnea.  Cardiovascular risk factors include: dyslipidemia, obesity (BMI >= 30 kg/m2), sedentary lifestyle and smoking/ tobacco exposure.  Patient is a chronic everyday tobacco smoker. He has a history of emphysema. He is currently asymptomatic. He denies dyspnea, SOB, chest pains, and palpitations. He has been smoking 2 packs per day for greater than 30 years.  Past Medical History  Diagnosis Date  . Abdominal aneurysm (HCC)   . Pulmonary nodule   . Hypertension   . Migraine-cluster headache syndrome   . Emphysema of lung [facility]    Past Surgical History  Procedure Laterality Date  . Hernia repair    . Tonsillectomy    No Known Allergies  Immunization History  Administered Date(s) Administered  . Pneumococcal Polysaccharide-23 01/18/2016  . Tdap 05/10/2014   Review of Systems  Constitutional: Negative.  Negative for fatigue.  HENT: Positive for dental problem.   Eyes: Negative.   Respiratory: Negative.  Negative for apnea, cough, chest tightness and shortness of breath.   Cardiovascular: Negative.   Gastrointestinal: Negative.  Negative for abdominal pain and abdominal distention.  Endocrine: Negative.  Negative for polydipsia, polyphagia and polyuria.  Genitourinary: Negative.        Erectile dysfunction   Musculoskeletal: Negative.   Skin: Negative.   Allergic/Immunologic: Negative.   Neurological: Negative.    Hematological: Negative.   Psychiatric/Behavioral: Negative.  Negative for suicidal ideas and sleep disturbance.       Objective:   Physical Exam  Constitutional: He is oriented to person, place, and time. He appears well-developed and well-nourished.  HENT:  Head: Normocephalic and atraumatic.  Right Ear: External ear normal.  Left Ear: External ear normal.  Nose: Nose normal.  Mouth/Throat: Oropharynx is clear and moist. Abnormal dentition. Dental caries present.  Partially edentulous  Eyes: Conjunctivae and EOM are normal. Pupils are equal, round, and reactive to light.  Neck: Normal range of motion. Neck supple.  Cardiovascular: Normal rate, regular rhythm, normal heart sounds and intact distal pulses.   Pulmonary/Chest: Effort normal and breath sounds normal.  Abdominal: Soft. Bowel sounds are normal.  Musculoskeletal: Normal range of motion.  Neurological: He is alert and oriented to person, place, and time. He has normal reflexes.  Skin: Skin is warm and dry.  Psychiatric: He has a normal mood and affect. His behavior is normal. Judgment and thought content normal.     BP 145/83 mmHg  Pulse 90  Temp(Src) 97.8 F (36.6 C) (Oral)  Resp 18  Ht  (1.803 Armstrong)  Wt 241 lb (109.317 kg)  BMI 33.63 kg/m2 Assessment & Plan:  1. Essential hypertension Blood pressure at goal on current medication regimen. Will continue on dosage. The patient is asked to make an attempt to improve diet and exercise patterns to aid in medical management of this problem. - COMPLETE METABOLIC PANEL WITH GFR - cloNIDine (CATAPRES) 0.1 MG tablet; Take 1 tablet (0.1 mg total) by mouth  daily.  Dispense: 60 tablet; Refill: 0 - hydrochlorothiazide (HYDRODIURIL) 25 MG tablet; Take 1 tablet (25 mg total) by mouth daily.  Dispense: 30 tablet; Refill: 0 - simvastatin (ZOCOR) 20 MG tablet; Take 1 tablet (20 mg total) by mouth daily.  Dispense: 30 tablet; Refill: 0 - amLODipine (NORVASC) 10 MG tablet; Take 1  tablet (10 mg total) by mouth daily.  Dispense: 30 tablet; Refill: 2 - POCT urinalysis dip (device)  2. Cerumen impaction, right - Ear Lavage  3. Obesity Recommend a lowfat, low carbohydrate diet divided over 5-6 small meals, increase water intake to 6-8 glasses, and 150 minutes per week of cardiovascular exercise.   - Hemoglobin A1c - CBC with Differential - TSH  4. Smoker Patient is currently smoking 2 packs per day. Smoking cessation instruction/counseling given:  counseled patient on the dangers of tobacco use, advised patient to stop smoking, and reviewed strategies to maximize success  5. Immunization due - Pneumococcal polysaccharide vaccine 23-valent greater than or equal to 2yo subcutaneous/IM  6. Hyperlipidemia - Lipid Panel; Future  7. Dental caries - Ambulatory referral to Dentistry  8. Partially edentulous mandible, unspecified edentulism - Ambulatory referral to Dentistry  Routine Health Maintenance:   Return in 1 month for hypertension Return for a digital rectal examination  Recommend a screening colonoscopy History AAA, will check abdominal ultrasound  Cameron Sluder M, FNP     The patient was given clear instructions to go to ER or return to medical center if symptoms do not improve, worsen or new problems develop. The patient verbalized understanding. Will notify patient with laboratory results.

## 2016-01-19 LAB — TSH: TSH: 1.14 mIU/L (ref 0.40–4.50)

## 2016-01-19 MED FILL — cloNIDine HCL 0.1 MG TABS: 0.1 | 30 days supply | Qty: 30 | Fill #0

## 2016-01-19 MED FILL — HYDROCHLOROTHIAZIDE 25 MG T: 25 | 30 days supply | Qty: 30 | Fill #0

## 2016-01-19 MED FILL — SIMVASTATIN 20 MG TABLET: 20 | 30 days supply | Qty: 30 | Fill #0

## 2016-01-19 MED FILL — AMLODIPINE BESYLATE 10 MG T: 10 | 30 days supply | Qty: 30 | Fill #0

## 2016-01-21 ENCOUNTER — Other Ambulatory Visit (INDEPENDENT_AMBULATORY_CARE_PROVIDER_SITE_OTHER): Payer: Self-pay

## 2016-01-21 DIAGNOSIS — E785 Hyperlipidemia, unspecified: Secondary | ICD-10-CM

## 2016-01-21 LAB — LIPID PANEL
CHOLESTEROL: 182 mg/dL (ref 125–200)
HDL: 44 mg/dL (ref >=40)
LDL Cholesterol: 111 mg/dL (ref <130)
Total CHOL/HDL Ratio: 4.1 Ratio (ref <=5.0)
Triglycerides: 136 mg/dL (ref <150)
VLDL: 27 mg/dL (ref <30)

## 2016-02-18 ENCOUNTER — Encounter: Payer: Self-pay | Admitting: Family Medicine

## 2016-02-18 ENCOUNTER — Ambulatory Visit (INDEPENDENT_AMBULATORY_CARE_PROVIDER_SITE_OTHER): Payer: No Typology Code available for payment source | Admitting: Family Medicine

## 2016-02-18 VITALS — BP 110/80 | HR 97 | Temp 98.1°F | Resp 16 | Ht 71.0 in | Wt 232.0 lb

## 2016-02-18 DIAGNOSIS — N529 Male erectile dysfunction, unspecified: Secondary | ICD-10-CM

## 2016-02-18 DIAGNOSIS — F17200 Nicotine dependence, unspecified, uncomplicated: Secondary | ICD-10-CM | POA: Insufficient documentation

## 2016-02-18 DIAGNOSIS — I10 Essential (primary) hypertension: Secondary | ICD-10-CM | POA: Insufficient documentation

## 2016-02-18 DIAGNOSIS — E785 Hyperlipidemia, unspecified: Secondary | ICD-10-CM

## 2016-02-18 LAB — POCT URINALYSIS DIP (DEVICE)
Bilirubin Urine: NEGATIVE
Glucose, UA: NEGATIVE mg/dL
Hgb urine dipstick: NEGATIVE
Ketones, ur: NEGATIVE mg/dL
Leukocytes, UA: NEGATIVE
NITRITE: NEGATIVE
PROTEIN: NEGATIVE mg/dL
Specific Gravity, Urine: 1.02 (ref 1.005–1.030)
Urobilinogen, UA: 0.2 mg/dL (ref 0.0–1.0)
pH: 5.5 (ref 5.0–8.0)

## 2016-02-18 MED ORDER — AMLODIPINE BESYLATE 10 MG PO TABS: 10.0000 mg | ORAL_TABLET | Freq: Every day | ORAL | Status: DC

## 2016-02-18 MED ORDER — HYDROCHLOROTHIAZIDE 25 MG PO TABS: 25.0000 mg | ORAL_TABLET | Freq: Every day | ORAL | Status: DC

## 2016-02-18 MED ORDER — SILDENAFIL CITRATE 25 MG PO TABS: 25.0000 mg | ORAL_TABLET | Freq: Every day | ORAL | Status: DC | PRN

## 2016-02-18 MED ORDER — SIMVASTATIN 20 MG PO TABS: 20.0000 mg | ORAL_TABLET | Freq: Every day | ORAL | Status: DC

## 2016-02-18 MED ORDER — CLONIDINE HCL 0.1 MG PO TABS: 0.1000 mg | ORAL_TABLET | Freq: Every day | ORAL | Status: DC

## 2016-02-18 MED FILL — HYDROCHLOROTHIAZIDE 25 MG T: 25 | 30 days supply | Qty: 30 | Fill #0

## 2016-02-18 MED FILL — SIMVASTATIN 20 MG TABLET: 20 | 30 days supply | Qty: 30 | Fill #0

## 2016-02-18 MED FILL — AMLODIPINE BESYLATE 10 MG T: 10 | 30 days supply | Qty: 30 | Fill #0

## 2016-02-18 MED FILL — ?CLONIDINE HCL 0.1 MG TABL: 0.1 | 30 days supply | Qty: 30 | Fill #0

## 2016-02-18 NOTE — Progress Notes (Signed)
Subjective:    Patient ID: Cameron Armstrong, male    DOB: Aug 23, 1956, 60 y.o.   MRN: [id number]  HPI [name], a 60 year old male with a history of hypertension and hyperlipidemia that presents for a 1 month follow-up. H  Patient is here for evaluation of hypertension and hyperlipidemia. Blood pressure is controlled at home. Patient denies chest pain, dyspnea, fatigue, irregular heart beat, orthopnea, palpitations, syncope and tachypnea.  Cardiovascular risk factors include: dyslipidemia, obesity (BMI >= 30 kg/m2), sedentary lifestyle and smoking/ tobacco exposure.   Patient is complaining of symptoms of erectile dysfunction.  Patient complains of erectile dysfunction.  Onset of dysfunction was several months ago and was in onset.  Patient states he no longer has full erections. He is able to have partial erections. Libido is not affected. Risk factors for ED include hypertension.  Patient states that he recently started a relationship with a younger partner. He states that he has never been treated for erectile dysfunction.   Patient is a chronic everyday tobacco smoker. He has a history of emphysema. He is currently asymptomatic. He denies dyspnea, SOB, chest pains, and palpitations. He has been smoking 2 packs per day for greater than 30 years.  Past Medical History  Diagnosis Date  . Abdominal aneurysm (HCC)   . Pulmonary nodule   . Hypertension   . Migraine-cluster headache syndrome   . Emphysema of lung [facility])    Past Surgical History  Procedure Laterality Date  . Hernia repair    . Tonsillectomy    No Known Allergies  Immunization History  Administered Date(s) Administered  . Pneumococcal Polysaccharide-23 01/18/2016  . Tdap 05/10/2014   Review of Systems  Constitutional: Negative.  Negative for fatigue.  HENT: Positive for dental problem.   Eyes: Negative.   Respiratory: Negative.  Negative for apnea, cough, chest tightness and shortness of breath.   Cardiovascular:  Negative.   Gastrointestinal: Negative.  Negative for abdominal pain and abdominal distention.  Endocrine: Negative.  Negative for polydipsia, polyphagia and polyuria.  Genitourinary: Negative.        Erectile dysfunction   Musculoskeletal: Negative.   Skin: Negative.   Allergic/Immunologic: Negative.   Neurological: Negative.   Hematological: Negative.   Psychiatric/Behavioral: Negative.  Negative for suicidal ideas and sleep disturbance.       Objective:   Physical Exam  Constitutional: He is oriented to person, place, and time. He appears well-developed and well-nourished.  HENT:  Head: Normocephalic and atraumatic.  Right Ear: External ear normal.  Left Ear: External ear normal.  Nose: Nose normal.  Mouth/Throat: Oropharynx is clear and moist. Abnormal dentition. Dental caries present.  Partially edentulous  Eyes: Conjunctivae and EOM are normal. Pupils are equal, round, and reactive to light.  Neck: Normal range of motion. Neck supple.  Cardiovascular: Normal rate, regular rhythm, normal heart sounds and intact distal pulses.   Pulmonary/Chest: Effort normal and breath sounds normal.  Abdominal: Soft. Bowel sounds are normal.  Genitourinary: Prostate normal and testes normal. Rectal exam shows anal tone normal. Prostate is not enlarged and not tender. Right testis shows no mass and no tenderness. Left testis shows no mass and no tenderness. Uncircumcised.  Musculoskeletal: Normal range of motion.  Neurological: He is alert and oriented to person, place, and time. He has normal reflexes.  Skin: Skin is warm and dry.  Psychiatric: He has a normal mood and affect. His behavior is normal. Judgment and thought content normal.     BP  110/80 mmHg  Pulse 97  Temp(Src) 98.1 F (36.7 C) (Oral)  Resp 16  Ht 5\' 11"  (1.803 m)  Wt 232 lb (105.235 kg)  BMI 32.37 kg/m2 Assessment & Plan:  1. Essential hypertension Blood pressure at goal on current medication regimen. Will continue  on dosage. The patient is asked to make an attempt to improve diet and exercise patterns to aid in medical management of this problem.  - amLODipine (NORVASC) 10 MG tablet; Take 1 tablet (10 mg total) by mouth daily.  Dispense: 30 tablet; Refill: 2 - hydrochlorothiazide (HYDRODIURIL) 25 MG tablet; Take 1 tablet (25 mg total) by mouth daily.  Dispense: 30 tablet; Refill: 2 - cloNIDine (CATAPRES) 0.1 MG tablet; Take 1 tablet (0.1 mg total) by mouth daily.  Dispense: 30 tablet; Refill: 2  2. Hyperlipidemia  - simvastatin (ZOCOR) 20 MG tablet; Take 1 tablet (20 mg total) by mouth daily.  Dispense: 30 tablet; Refill: 2  3. Erectile dysfunction, unspecified erectile dysfunction type Discussed erectile dysfunction at length. We gave p - sildenafil (VIAGRA) 25 MG tablet; Take 1 tablet (25 mg total) by mouth daily as needed for erectile dysfunction.  Dispense: 10 tablet; Refill: 0  4. Tobacco dependence Smoking cessation instruction/counseling given:  counseled patient on the dangers of tobacco use, advised patient to stop smoking, and reviewed strategies to maximize success    Routine Health Maintenance:   Return in 3 months for hypertension Return for a digital rectal examination     Cathleen Yagi M, FNP     The patient was given clear instructions to go to ER or return to medical center if symptoms do not improve, worsen or new problems develop. The patient verbalized understanding. Will notify patient with laboratory results.

## 2016-02-18 NOTE — Patient Instructions (Signed)
Sildenafil tablets (Viagra) What is this medicine? SILDENAFIL (sil DEN a fil) is used to treat erection problems in men. This medicine may be used for other purposes; ask your health care provider or pharmacist if you have questions. What should I tell my health care provider before I take this medicine? They need to know if you have any of these conditions: -bleeding disorders -eye or vision problems, including a rare inherited eye disease called retinitis pigmentosa -anatomical deformation of the penis, Peyronie's disease, or history of priapism (painful and prolonged erection) -heart disease, angina, a history of heart attack, irregular heart beats, or other heart problems -high or low blood pressure -history of blood diseases, like sickle cell anemia or leukemia -history of stomach bleeding -kidney disease -liver disease -stroke -an unusual or allergic reaction to sildenafil, other medicines, foods, dyes, or preservatives -pregnant or trying to get pregnant -breast-feeding How should I use this medicine? Take this medicine by mouth with a glass of water. Follow the directions on the prescription label. The dose is usually taken 1 hour before sexual activity. You should not take the dose more than once per day. Do not take your medicine more often than directed. Talk to your pediatrician regarding the use of this medicine in children. This medicine is not used in children for this condition. Overdosage: If you think you have taken too much of this medicine contact a poison control center or emergency room at once. NOTE: This medicine is only for you. Do not share this medicine with others. What if I miss a dose? This does not apply. Do not take double or extra doses. What may interact with this medicine? Do not take this medicine with any of the following medications: -cisapride -methscopolamine nitrate -nitrates like amyl nitrite, isosorbide dinitrate, isosorbide mononitrate,  nitroglycerin -nitroprusside -other medicines for erectile dysfunction like avanafil, tadalafil, vardenafil -riociguat -other sildenafil products (Revatio) This medicine may also interact with the following medications: -certain drugs for high blood pressure -certain drugs for the treatment of HIV infection or AIDS -certain drugs used for fungal or yeast infections, like fluconazole, itraconazole, ketoconazole, and voriconazole -cimetidine -erythromycin -rifampin This list may not describe all possible interactions. Give your health care provider a list of all the medicines, herbs, non-prescription drugs, or dietary supplements you use. Also tell them if you smoke, drink alcohol, or use illegal drugs. Some items may interact with your medicine. What should I watch for while using this medicine? If you notice any changes in your vision while taking this drug, call your doctor or health care professional as soon as possible. Stop using this medicine and call your health care provider right away if you have a loss of sight in one or both eyes. Contact your doctor or health care professional right away if you have an erection that lasts longer than 4 hours or if it becomes painful. This may be a sign of a serious problem and must be treated right away to prevent permanent damage. If you experience symptoms of nausea, dizziness, chest pain or arm pain upon initiation of sexual activity after taking this medicine, you should refrain from further activity and call your doctor or health care professional as soon as possible. Do not drink alcohol to excess (examples, 5 glasses of wine or 5 shots of whiskey) when taking this medicine. When taken in excess, alcohol can increase your chances of getting a headache or getting dizzy, increasing your heart rate or lowering your blood pressure. Using this medicine  does not protect you or your partner against HIV infection (the virus that causes AIDS) or other  sexually transmitted diseases. What side effects may I notice from receiving this medicine? Side effects that you should report to your doctor or health care professional as soon as possible: -allergic reactions like skin rash, itching or hives, swelling of the face, lips, or tongue -breathing problems -changes in hearing -changes in vision -chest pain -fast, irregular heartbeat -prolonged or painful erection -seizures Side effects that usually do not require medical attention (report to your doctor or health care professional if they continue or are bothersome): -back pain -dizziness -flushing -headache -indigestion -muscle aches -nausea -stuffy or runny nose This list may not describe all possible side effects. Call your doctor for medical advice about side effects. You may report side effects to FDA at 1-800-FDA-1088. Where should I keep my medicine? Keep out of reach of children. Store at room temperature between 15 and 30 degrees C (59 and 86 degrees F). Throw away any unused medicine after the expiration date. NOTE: This sheet is a summary. It may not cover all possible information. If you have questions about this medicine, talk to your doctor, pharmacist, or health care provider.    2016, Elsevier/Gold Standard. (2014-04-10 13:19:04) Erectile Dysfunction Erectile dysfunction is the inability to get or sustain a good enough erection to have sexual intercourse. Erectile dysfunction may involve:  Inability to get an erection.  Lack of enough hardness to allow penetration.  Loss of the erection before sex is finished.  Premature ejaculation. CAUSES  Certain drugs, such as:  Pain relievers.  Antihistamines.  Antidepressants.  Blood pressure medicines.  Water pills (diuretics).  Ulcer medicines.  Muscle relaxants.  Illegal drugs.  Excessive drinking.  Psychological causes, such as:  Anxiety.  Depression.  Sadness.  Exhaustion.  Performance  fear.  Stress.  Physical causes, such as:  Artery problems. This may include diabetes, smoking, liver disease, or atherosclerosis.  High blood pressure.  Hormonal problems, such as low testosterone.  Obesity.  Nerve problems. This may include back or pelvic injuries, diabetes mellitus, multiple sclerosis, or Parkinson disease. SYMPTOMS  Inability to get an erection.  Lack of enough hardness to allow penetration.  Loss of the erection before sex is finished.  Premature ejaculation.  Normal erections at some times, but with frequent unsatisfactory episodes.  Orgasms that are not satisfactory in sensation or frequency.  Low sexual satisfaction in either partner because of erection problems.  A curved penis occurring with erection. The curve may cause pain or may be too curved to allow for intercourse.  Never having nighttime erections. DIAGNOSIS Your caregiver can often diagnose this condition by:  Performing a physical exam to find other diseases or specific problems with the penis.  Asking you detailed questions about the problem.  Performing blood tests to check for diabetes mellitus or to measure hormone levels.  Performing urine tests to find other underlying health conditions.  Performing an ultrasound exam to check for scarring.  Performing a test to check blood flow to the penis.  Doing a sleep study at home to measure nighttime erections. TREATMENT   You may be prescribed medicines by mouth.  You may be given medicine injections into the penis.  You may be prescribed a vacuum pump with a ring.  Penile implant surgery may be performed. You may receive:  An inflatable implant.  A semirigid implant.  Blood vessel surgery may be performed. HOME CARE INSTRUCTIONS  If you are  prescribed oral medicine, you should take the medicine as prescribed. Do not increase the dosage without first discussing it with your physician.  If you are using  self-injections, be careful to avoid any veins that are on the surface of the penis. Apply pressure to the injection site for 5 minutes.  If you are using a vacuum pump, make sure you have read the instructions before using it. Discuss any questions with your physician before taking the pump home. SEEK MEDICAL CARE IF:  You experience pain that is not responsive to the pain medicine you have been prescribed.  You experience nausea or vomiting. SEEK IMMEDIATE MEDICAL CARE IF:   When taking oral or injectable medications, you experience an erection that lasts longer than 4 hours. If your physician is unavailable, go to the nearest emergency room for evaluation. An erection that lasts much longer than 4 hours can result in permanent damage to your penis.  You have pain that is severe.  You develop redness, severe pain, or severe swelling of your penis.  You have redness spreading up into your groin or lower abdomen.  You are unable to pass your urine.   This information is not intended to replace advice given to you by your health care provider. Make sure you discuss any questions you have with your health care provider.   Document Released: 11/17/2000 Document Revised: 07/23/2013 Document Reviewed: 04/24/2013 Elsevier Interactive Patient Education [employer]

## 2016-04-05 MED FILL — !VIAGRA 25 MG TABLET: 25 | 30 days supply | Qty: 5 | Fill #0

## 2016-04-17 ENCOUNTER — Other Ambulatory Visit: Payer: Self-pay | Admitting: Internal Medicine

## 2016-04-17 DIAGNOSIS — N529 Male erectile dysfunction, unspecified: Secondary | ICD-10-CM

## 2016-04-17 MED ORDER — SILDENAFIL CITRATE 25 MG PO TABS: 25.0000 mg | ORAL_TABLET | Freq: Every day | ORAL | Status: DC | PRN

## 2016-04-18 ENCOUNTER — Telehealth: Payer: Self-pay

## 2016-04-18 DIAGNOSIS — I10 Essential (primary) hypertension: Secondary | ICD-10-CM

## 2016-04-18 MED ORDER — HYDROCHLOROTHIAZIDE 25 MG PO TABS: 25.0000 mg | ORAL_TABLET | Freq: Every day | ORAL | Status: DC

## 2016-04-18 MED ORDER — AMLODIPINE BESYLATE 10 MG PO TABS: 10.0000 mg | ORAL_TABLET | Freq: Every day | ORAL | Status: DC

## 2016-04-18 MED ORDER — CLONIDINE HCL 0.1 MG PO TABS: 0.1000 mg | ORAL_TABLET | Freq: Every day | ORAL | Status: DC

## 2016-04-18 NOTE — Telephone Encounter (Signed)
Refills for bp med sent into pharmacy. Thanks!

## 2016-04-18 NOTE — Telephone Encounter (Signed)
Pt is requesting a refill on his BP medication. Thanks

## 2016-04-19 MED FILL — ?CLONIDINE HCL 0.1 MG TABL: 0.1 | 30 days supply | Qty: 30 | Fill #1

## 2016-04-19 MED FILL — AMLODIPINE BESYLATE 10 MG T: 10 | 30 days supply | Qty: 30 | Fill #1

## 2016-04-19 MED FILL — SIMVASTATIN 20 MG TABLET: 20 | 30 days supply | Qty: 30 | Fill #1

## 2016-04-19 MED FILL — ?HYDROCHLOROTHIAZIDE 25 MG: 25 MG | 30 days supply | Qty: 30 | Fill #1

## 2016-05-02 ENCOUNTER — Ambulatory Visit: Payer: No Typology Code available for payment source | Attending: Internal Medicine

## 2016-05-05 MED FILL — $VIAGRA 25 MG TABLET: 25 MG | 30 days supply | Qty: 3 | Fill #1

## 2016-05-22 ENCOUNTER — Encounter: Payer: Self-pay | Admitting: Family Medicine

## 2016-05-22 ENCOUNTER — Ambulatory Visit (INDEPENDENT_AMBULATORY_CARE_PROVIDER_SITE_OTHER): Payer: No Typology Code available for payment source | Admitting: Family Medicine

## 2016-05-22 VITALS — BP 131/89 | HR 85 | Resp 18 | Wt 217.0 lb

## 2016-05-22 DIAGNOSIS — I10 Essential (primary) hypertension: Secondary | ICD-10-CM

## 2016-05-22 DIAGNOSIS — E785 Hyperlipidemia, unspecified: Secondary | ICD-10-CM

## 2016-05-22 DIAGNOSIS — F17200 Nicotine dependence, unspecified, uncomplicated: Secondary | ICD-10-CM

## 2016-05-22 LAB — POCT URINALYSIS DIP (DEVICE)
Bilirubin Urine: NEGATIVE
GLUCOSE, UA: NEGATIVE mg/dL
Hgb urine dipstick: NEGATIVE
KETONES UR: NEGATIVE mg/dL
LEUKOCYTES UA: NEGATIVE
Nitrite: NEGATIVE
Protein, ur: NEGATIVE mg/dL
SPECIFIC GRAVITY, URINE: 1.01 (ref 1.005–1.030)
UROBILINOGEN UA: 0.2 mg/dL (ref 0.0–1.0)
pH: 5 (ref 5.0–8.0)

## 2016-05-22 LAB — COMPLETE METABOLIC PANEL WITH GFR
ALBUMIN: 4.1 g/dL (ref 3.6–5.1)
ALK PHOS: 75 U/L (ref 40–115)
ALT: 15 U/L (ref 9–46)
AST: 17 U/L (ref 10–35)
BUN: 24 mg/dL (ref 7–25)
CALCIUM: 9.4 mg/dL (ref 8.6–10.3)
CHLORIDE: 103 mmol/L (ref 98–110)
CO2: 24 mmol/L (ref 20–31)
Creat: 0.81 mg/dL (ref 0.70–1.33)
GFR, Est Non African American: >89 mL/min (ref >=60)
Glucose, Bld: 74 mg/dL (ref 65–99)
POTASSIUM: 4 mmol/L (ref 3.5–5.3)
Sodium: 138 mmol/L (ref 135–146)
Total Bilirubin: 0.5 mg/dL (ref 0.2–1.2)
Total Protein: 7.1 g/dL (ref 6.1–8.1)

## 2016-05-22 MED ORDER — VARENICLINE TARTRATE 1 MG PO TABS: 1.0000 mg | ORAL_TABLET | Freq: Two times a day (BID) | ORAL | Status: DC

## 2016-05-22 MED ORDER — CLONIDINE HCL 0.1 MG PO TABS: 0.1000 mg | ORAL_TABLET | Freq: Every day | ORAL | Status: DC

## 2016-05-22 MED ORDER — HYDROCHLOROTHIAZIDE 25 MG PO TABS: 25.0000 mg | ORAL_TABLET | Freq: Every day | ORAL | Status: DC

## 2016-05-22 MED ORDER — VARENICLINE TARTRATE 0.5 MG X 11 & 1 MG X 42 PO MISC: ORAL | Status: DC

## 2016-05-22 MED ORDER — AMLODIPINE BESYLATE 10 MG PO TABS: 10.0000 mg | ORAL_TABLET | Freq: Every day | ORAL | Status: DC

## 2016-05-22 MED ORDER — SIMVASTATIN 20 MG PO TABS: 20.0000 mg | ORAL_TABLET | Freq: Every day | ORAL | Status: DC

## 2016-05-22 MED FILL — AMLODIPINE BESYLATE 10 MG T: 10 | 30 days supply | Qty: 30 | Fill #0

## 2016-05-22 MED FILL — ?CLONIDINE HCL 0.1 MG TABLE: 0.1 MG | 30 days supply | Qty: 30 | Fill #0

## 2016-05-22 MED FILL — HYDROCHLOROTHIAZIDE 25 MG T: 25 | 30 days supply | Qty: 30 | Fill #0

## 2016-05-22 MED FILL — ?SIMVASTATIN 20 MG TABLET: 20 MG | 30 days supply | Qty: 30 | Fill #0

## 2016-05-22 NOTE — Patient Instructions (Signed)
DASH Eating Plan  DASH stands for "Dietary Approaches to Stop Hypertension." The DASH eating plan is a healthy eating plan that has been shown to reduce high blood pressure (hypertension). Additional health benefits may include reducing the risk of type 2 diabetes mellitus, heart disease, and stroke. The DASH eating plan may also help with weight loss.  WHAT DO I NEED TO KNOW ABOUT THE DASH EATING PLAN?  For the DASH eating plan, you will follow these general guidelines:  · Choose foods with a percent daily value for sodium of less than 5% (as listed on the food label).  · Use salt-free seasonings or herbs instead of table salt or sea salt.  · Check with your health care provider or pharmacist before using salt substitutes.  · Eat lower-sodium products, often labeled as "lower sodium" or "no salt added."  · Eat fresh foods.  · Eat more vegetables, fruits, and low-fat dairy products.  · Choose whole grains. Look for the word "whole" as the first word in the ingredient list.  · Choose fish and skinless chicken or turkey more often than red meat. Limit fish, poultry, and meat to 6 oz (170 g) each day.  · Limit sweets, desserts, sugars, and sugary drinks.  · Choose heart-healthy fats.  · Limit cheese to 1 oz (28 g) per day.  · Eat more home-cooked food and less restaurant, buffet, and fast food.  · Limit fried foods.  · Cook foods using methods other than frying.  · Limit canned vegetables. If you do use them, rinse them well to decrease the sodium.  · When eating at a restaurant, ask that your food be prepared with less salt, or no salt if possible.  WHAT FOODS CAN I EAT?  Seek help from a dietitian for individual calorie needs.  Grains  Whole grain or whole wheat bread. Brown rice. Whole grain or whole wheat pasta. Quinoa, bulgur, and whole grain cereals. Low-sodium cereals. Corn or whole wheat flour tortillas. Whole grain cornbread. Whole grain crackers. Low-sodium crackers.  Vegetables  Fresh or frozen vegetables  (raw, steamed, roasted, or grilled). Low-sodium or reduced-sodium tomato and vegetable juices. Low-sodium or reduced-sodium tomato sauce and paste. Low-sodium or reduced-sodium canned vegetables.   Fruits  All fresh, canned (in natural juice), or frozen fruits.  Meat and Other Protein Products  Ground beef (85% or leaner), grass-fed beef, or beef trimmed of fat. Skinless chicken or turkey. Ground chicken or turkey. Pork trimmed of fat. All fish and seafood. Eggs. Dried beans, peas, or lentils. Unsalted nuts and seeds. Unsalted canned beans.  Dairy  Low-fat dairy products, such as skim or 1% milk, 2% or reduced-fat cheeses, low-fat ricotta or cottage cheese, or plain low-fat yogurt. Low-sodium or reduced-sodium cheeses.  Fats and Oils  Tub margarines without trans fats. Light or reduced-fat mayonnaise and salad dressings (reduced sodium). Avocado. Safflower, olive, or canola oils. Natural peanut or almond butter.  Other  Unsalted popcorn and pretzels.  The items listed above may not be a complete list of recommended foods or beverages. Contact your dietitian for more options.  WHAT FOODS ARE NOT RECOMMENDED?  Grains  White bread. White pasta. White rice. Refined cornbread. Bagels and croissants. Crackers that contain trans fat.  Vegetables  Creamed or fried vegetables. Vegetables in a cheese sauce. Regular canned vegetables. Regular canned tomato sauce and paste. Regular tomato and vegetable juices.  Fruits  Dried fruits. Canned fruit in light or heavy syrup. Fruit juice.  Meat and Other Protein   Products  Fatty cuts of meat. Ribs, chicken wings, bacon, sausage, bologna, salami, chitterlings, fatback, hot dogs, bratwurst, and packaged luncheon meats. Salted nuts and seeds. Canned beans with salt.  Dairy  Whole or 2% milk, cream, half-and-half, and cream cheese. Whole-fat or sweetened yogurt. Full-fat cheeses or blue cheese. Nondairy creamers and whipped toppings. Processed cheese, cheese spreads, or cheese  curds.  Condiments  Onion and garlic salt, seasoned salt, table salt, and sea salt. Canned and packaged gravies. Worcestershire sauce. Tartar sauce. Barbecue sauce. Teriyaki sauce. Soy sauce, including reduced sodium. Steak sauce. Fish sauce. Oyster sauce. Cocktail sauce. Horseradish. Ketchup and mustard. Meat flavorings and tenderizers. Bouillon cubes. Hot sauce. Tabasco sauce. Marinades. Taco seasonings. Relishes.  Fats and Oils  Butter, stick margarine, lard, shortening, ghee, and bacon fat. Coconut, palm kernel, or palm oils. Regular salad dressings.  Other  Pickles and olives. Salted popcorn and pretzels.  The items listed above may not be a complete list of foods and beverages to avoid. Contact your dietitian for more information.  WHERE CAN I FIND MORE INFORMATION?  National Heart, [employer], [employer] Institute: www.nhlbi.nih.gov/health/health-topics/topics/dash/     This information is not intended to replace advice given to you by your health care provider. Make sure you discuss any questions you have with your health care provider.     Document Released: 11/09/2011 Document Revised: 12/11/2014 Document Reviewed: 09/24/2013  Elsevier Interactive Patient Education ©2016 [employer]

## 2016-05-22 NOTE — Progress Notes (Signed)
Subjective:    Patient ID: Cameron Armstrong, male    DOB: 06-Mar-1956, 60 y.o.   MRN: [id number]  HPI Cameron Armstrong, a 61 year old male with a history of hypertension and hyperlipidemia that presents for a 3 month follow-up of chronic conditions. H  Patient is here for evaluation of hypertension and hyperlipidemia. Blood pressure is controlled at home. Patient denies chest pain, dyspnea, fatigue, irregular heart beat, orthopnea, palpitations, syncope and tachypnea.  Cardiovascular risk factors include: dyslipidemia, obesity (BMI >= 30 kg/m2), sedentary lifestyle and smoking/ tobacco exposure.   Patient is a chronic everyday tobacco smoker. He has a history of emphysema. He is currently asymptomatic. He denies dyspnea, SOB, chest pains, and palpitations. He has been smoking 2 packs per day for greater than 30 years.  Past Medical History  Diagnosis Date  . Abdominal aneurysm (HCC)   . Pulmonary nodule   . Hypertension   . Migraine-cluster headache syndrome   . Emphysema of lung [facility])    Past Surgical History  Procedure Laterality Date  . Hernia repair    . Tonsillectomy    No Known Allergies  Immunization History  Administered Date(s) Administered  . Pneumococcal Polysaccharide-23 01/18/2016  . Tdap 05/10/2014   Review of Systems  Constitutional: Negative.  Negative for fatigue.  HENT: Positive for dental problem.   Eyes: Negative.   Respiratory: Negative.  Negative for apnea, cough, chest tightness and shortness of breath.   Cardiovascular: Negative.   Gastrointestinal: Negative.  Negative for abdominal pain and abdominal distention.  Endocrine: Negative.  Negative for polydipsia, polyphagia and polyuria.  Genitourinary: Negative.        Erectile dysfunction   Musculoskeletal: Negative.   Skin: Negative.   Allergic/Immunologic: Negative.   Neurological: Negative.   Hematological: Negative.   Psychiatric/Behavioral: Negative.  Negative for suicidal ideas and sleep  disturbance.       Objective:   Physical Exam  Constitutional: He is oriented to person, place, and time. He appears well-developed and well-nourished.  HENT:  Head: Normocephalic and atraumatic.  Right Ear: External ear normal.  Left Ear: External ear normal.  Nose: Nose normal.  Mouth/Throat: Oropharynx is clear and moist. Abnormal dentition. Dental caries present.  Partially edentulous  Eyes: Conjunctivae and EOM are normal. Pupils are equal, round, and reactive to light.  Neck: Normal range of motion. Neck supple.  Cardiovascular: Normal rate, regular rhythm, normal heart sounds and intact distal pulses.   Pulmonary/Chest: Effort normal and breath sounds normal.  Abdominal: Soft. Bowel sounds are normal.  Genitourinary: Prostate normal and testes normal. Rectal exam shows anal tone normal. Prostate is not enlarged and not tender. Right testis shows no mass and no tenderness. Left testis shows no mass and no tenderness. Uncircumcised.  Musculoskeletal: Normal range of motion.  Neurological: He is alert and oriented to person, place, and time. He has normal reflexes.  Skin: Skin is warm and dry.  Psychiatric: He has a normal mood and affect. His behavior is normal. Judgment and thought content normal.     BP 131/89 mmHg  Pulse 85  Resp 18  Wt 217 lb (98.431 kg)  SpO2 97% Assessment & Plan:  1. Essential hypertension Blood pressure at goal on current medication regimen. Will continue on dosage. The patient is asked to make an attempt to improve diet and exercise patterns to aid in medical management of this problem.  - COMPLETE METABOLIC PANEL WITH GFR - Urinalysis Dipstick - hydrochlorothiazide (HYDRODIURIL) 25 MG tablet; Take  1 tablet (25 mg total) by mouth daily.  Dispense: 30 tablet; Refill: 2 - cloNIDine (CATAPRES) 0.1 MG tablet; Take 1 tablet (0.1 mg total) by mouth daily.  Dispense: 30 tablet; Refill: 2 - amLODipine (NORVASC) 10 MG tablet; Take 1 tablet (10 mg total) by  mouth daily.  Dispense: 30 tablet; Refill: 2  2. Hyperlipidemia - simvastatin (ZOCOR) 20 MG tablet; Take 1 tablet (20 mg total) by mouth daily.  Dispense: 30 tablet; Refill: 2  3. Tobacco dependence Smoking cessation instruction/counseling given:  counseled patient on the dangers of tobacco use, advised patient to stop smoking, and reviewed strategies to maximize success   3. Tobacco dependence Patient is ready to quit smoking. He has established a quit date. Will start Chantix to assist with smoking cessation - varenicline (CHANTIX STARTING MONTH PAK) 0.5 MG X 11 & 1 MG X 42 tablet; Take one 0.5 mg tablet by mouth once daily for 3 days, then increase to one 0.5 mg tablet twice daily for 4 days, then increase to one 1 mg tablet twice daily.  Dispense: 53 tablet; Refill: 0 - varenicline (CHANTIX CONTINUING MONTH PAK) 1 MG tablet; Take 1 tablet (1 mg total) by mouth 2 (two) times daily.  Dispense: 60 tablet; Refill: 1     Routine Health Maintenance:   Return in 3 months for hypertension and hyperlipidemia     Jeffery Bachmeier M, FNP     The patient was given clear instructions to go to ER or return to medical center if symptoms do not improve, worsen or new problems develop. The patient verbalized understanding. Will notify patient with laboratory results.

## 2016-05-23 ENCOUNTER — Other Ambulatory Visit: Payer: Self-pay | Admitting: Family Medicine

## 2016-05-23 DIAGNOSIS — F17200 Nicotine dependence, unspecified, uncomplicated: Secondary | ICD-10-CM

## 2016-05-23 MED ORDER — VARENICLINE TARTRATE 0.5 MG X 11 & 1 MG X 42 PO MISC: ORAL | Status: DC

## 2016-05-23 MED FILL — $VIAGRA 25 MG TABLET: 25 MG | 30 days supply | Qty: 10 | Fill #0

## 2016-05-23 MED FILL — CHANTIX STARTING MONTH BOX: 0.5 MG X 11 | 30 days supply | Qty: 53 | Fill #0

## 2016-06-08 ENCOUNTER — Telehealth: Payer: Self-pay

## 2016-06-08 DIAGNOSIS — I10 Essential (primary) hypertension: Secondary | ICD-10-CM

## 2016-06-08 DIAGNOSIS — E785 Hyperlipidemia, unspecified: Secondary | ICD-10-CM

## 2016-06-08 MED ORDER — HYDROCHLOROTHIAZIDE 25 MG PO TABS: 25.0000 mg | ORAL_TABLET | Freq: Every day | ORAL | Status: DC

## 2016-06-08 MED ORDER — AMLODIPINE BESYLATE 10 MG PO TABS: 10.0000 mg | ORAL_TABLET | Freq: Every day | ORAL | Status: DC

## 2016-06-08 MED ORDER — SIMVASTATIN 20 MG PO TABS: 20.0000 mg | ORAL_TABLET | Freq: Every day | ORAL | Status: DC

## 2016-06-08 NOTE — Telephone Encounter (Signed)
Refills sent into pharmacy. Thanks!  
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
## 2016-06-27 MED FILL — !CHANTIX CONT MONTH BOX: 1 | 28 days supply | Qty: 56 | Fill #0

## 2016-06-27 MED FILL — $VIAGRA 25 MG TABLET: 25 MG | 30 days supply | Qty: 10 | Fill #1

## 2016-06-28 ENCOUNTER — Telehealth: Payer: Self-pay

## 2016-06-28 DIAGNOSIS — I10 Essential (primary) hypertension: Secondary | ICD-10-CM

## 2016-06-28 MED ORDER — AMLODIPINE BESYLATE 10 MG PO TABS: 10.0000 mg | ORAL_TABLET | Freq: Every day | ORAL | 2 refills | Status: DC

## 2016-06-28 MED FILL — AMLODIPINE BESYLATE 10 MG T: 10 | 30 days supply | Qty: 30 | Fill #0

## 2016-06-29 MED FILL — HYDROCHLOROTHIAZIDE 25 MG T: 25 | 30 days supply | Qty: 30 | Fill #1

## 2016-06-29 MED FILL — cloNIDine HCL 0.1 MG TABS: 0.1 | 30 days supply | Qty: 30 | Fill #1

## 2016-07-24 MED FILL — AMLODIPINE BESYLATE 10 MG T: 10 | 30 days supply | Qty: 30 | Fill #1

## 2016-07-24 MED FILL — ?SIMVASTATIN 20 MG TABLET: 20 MG | 30 days supply | Qty: 30 | Fill #0

## 2016-07-24 MED FILL — !CHANTIX CONT MONTH BOX: 1 | 28 days supply | Qty: 56 | Fill #1

## 2016-07-24 MED FILL — HYDROCHLOROTHIAZIDE 25 MG T: 25 | 30 days supply | Qty: 30 | Fill #2

## 2016-07-24 MED FILL — cloNIDine HCL 0.1 MG TABS: 0.1 | 30 days supply | Qty: 30 | Fill #2

## 2016-07-24 MED FILL — $VIAGRA 25 MG TABLET: 25 MG | 30 days supply | Qty: 10 | Fill #2

## 2016-08-22 ENCOUNTER — Ambulatory Visit: Payer: No Typology Code available for payment source | Admitting: Family Medicine

## 2016-08-25 ENCOUNTER — Other Ambulatory Visit: Payer: Self-pay | Admitting: Family Medicine

## 2016-08-25 DIAGNOSIS — I10 Essential (primary) hypertension: Secondary | ICD-10-CM

## 2016-08-25 MED FILL — ?SIMVASTATIN 20 MG TABLET: 20 MG | 30 days supply | Qty: 30 | Fill #1

## 2016-08-25 MED FILL — HYDROCHLOROTHIAZIDE 25 MG T: 25 | 30 days supply | Qty: 30 | Fill #0

## 2016-08-25 MED FILL — AMLODIPINE BESYLATE 10 MG T: 10 | 30 days supply | Qty: 30 | Fill #2

## 2016-08-25 MED FILL — cloNIDine HCL 0.1 MG TABS: 0.1 | 30 days supply | Qty: 30 | Fill #0

## 2016-09-15 ENCOUNTER — Ambulatory Visit: Payer: No Typology Code available for payment source | Admitting: Family Medicine

## 2016-09-25 ENCOUNTER — Encounter (HOSPITAL_COMMUNITY): Payer: Self-pay | Admitting: Family Medicine

## 2016-09-25 DIAGNOSIS — L03032 Cellulitis of left toe: Secondary | ICD-10-CM | POA: Insufficient documentation

## 2016-09-25 DIAGNOSIS — Z79899 Other long term (current) drug therapy: Secondary | ICD-10-CM | POA: Insufficient documentation

## 2016-09-25 DIAGNOSIS — I10 Essential (primary) hypertension: Secondary | ICD-10-CM | POA: Insufficient documentation

## 2016-09-25 DIAGNOSIS — F17210 Nicotine dependence, cigarettes, uncomplicated: Secondary | ICD-10-CM | POA: Insufficient documentation

## 2016-09-25 NOTE — ED Triage Notes (Signed)
Patient reports he has left big toe pain. However, he has a wound around the toenail. He reports the wound has been there for about one month and is concerned because it is getting worse. Denies fever.

## 2016-09-26 ENCOUNTER — Emergency Department (HOSPITAL_COMMUNITY)
Admission: EM | Admit: 2016-09-26 | Discharge: 2016-09-26 | Disposition: A | Discharge status: Home / Self Care | Payer: No Typology Code available for payment source | Attending: Emergency Medicine | Admitting: Emergency Medicine

## 2016-09-26 DIAGNOSIS — L03032 Cellulitis of left toe: Secondary | ICD-10-CM

## 2016-09-26 LAB — CBC
HCT: 52.4 % — ABNORMAL HIGH (ref 39.0–52.0)
HEMOGLOBIN: 18.1 g/dL — AB (ref 13.0–17.0)
MCH: 31.8 pg (ref 26.0–34.0)
MCHC: 34.5 g/dL (ref 30.0–36.0)
MCV: 92.1 fL (ref 78.0–100.0)
Platelets: 192 10*3/uL (ref 150–400)
RBC: 5.69 MIL/uL (ref 4.22–5.81)
RDW: 14.3 % (ref 11.5–15.5)
WBC: 12.5 10*3/uL — ABNORMAL HIGH (ref 4.0–10.5)

## 2016-09-26 MED ORDER — IBUPROFEN 200 MG PO TABS
600.0000 mg | ORAL_TABLET | Freq: Once | ORAL | Status: AC
  Administered 2016-09-26: 600 mg via ORAL
  Filled 2016-09-26: qty 3

## 2016-09-26 MED ORDER — SULFAMETHOXAZOLE-TRIMETHOPRIM 800-160 MG PO TABS
1.0000 | ORAL_TABLET | Freq: Once | ORAL | Status: AC
  Administered 2016-09-26: 1 via ORAL
  Filled 2016-09-26: qty 1

## 2016-09-26 MED ORDER — SULFAMETHOXAZOLE-TRIMETHOPRIM 800-160 MG PO TABS: 1.0000 | ORAL_TABLET | Freq: Two times a day (BID) | ORAL | 0 refills | Status: AC

## 2016-09-26 NOTE — Discharge Instructions (Signed)
Please read and follow all provided instructions.  Your diagnoses today include:  1. Cellulitis of great toe, right    Tests performed today include: Vital signs. See below for your results today.   Medications prescribed:  Take as prescribed   Home care instructions:  Follow any educational materials contained in this packet.  Follow-up instructions: Please follow-up with Podiatry for further evaluation of symptoms and treatment   Return instructions:  Please return to the Emergency Department if you do not get better, if you get worse, or new symptoms OR  - Fever (temperature greater than 101.46F)  - Bleeding that does not stop with holding pressure to the area    -Severe pain (please note that you may be more sore the day after your accident)  - Chest Pain  - Difficulty breathing  - Severe nausea or vomiting  - Inability to tolerate food and liquids  - Passing out  - Skin becoming red around your wounds  - Change in mental status (confusion or lethargy)  - New numbness or weakness    Please return if you have any other emergent concerns.  Additional Information:  Your vital signs today were: BP 121/83 (BP Location: Right Arm)    Pulse 86    Temp 98 F (36.7 C) (Oral)    Resp 22    Ht 5\' 11"  (1.803 m)    Wt 99.8 kg    SpO2 95%    BMI 30.68 kg/m  If your blood pressure (BP) was elevated above 135/85 this visit, please have this repeated by your doctor within one month. ---------------

## 2016-09-26 NOTE — ED Provider Notes (Signed)
[facility]-EMERGENCY DEPT Provider Note   CSN: [id number] Arrival date & time: 09/25/16  2347  History   Chief Complaint Chief Complaint  Patient presents with  . Toe Pain    HPI Cameron Armstrong is a 60 y.o. male.  HPI  60 y.o. male with a hx of HTN, presents to the Emergency Department today complaining of left great toe pain x 1 month. Pt states that he thinks it's infected. Notes pain 5/10. Worse with ambulation. States there has been no bleeding or any overt purulence. Noes minimal redness. Has not tried any OTC medications. Denies any fevers. No trauma to area. Notes poor hygiene to foot. No numbness/tingling. No N/V. No other symptoms noted    Past Medical History:  Diagnosis Date  . Abdominal aneurysm (HCC)   . Emphysema of lung (HCC)   . Hypertension   . Migraine-cluster headache syndrome   . Pulmonary nodule     Patient Active Problem List   Diagnosis Date Noted  . Essential hypertension 02/18/2016  . Hyperlipidemia 02/18/2016  . Tobacco dependence 02/18/2016  . Erectile dysfunction 02/18/2016  . Cough 06/21/2013  . Smoker 06/21/2013  . HBP (high blood pressure) 06/21/2013  . Abdominal aneurysm [facility])     Past Surgical History:  Procedure Laterality Date  . HERNIA REPAIR    . TONSILLECTOMY         Home Medications    Prior to Admission medications   Medication Sig Start Date End Date Taking? Authorizing Provider  albuterol (PROVENTIL HFA;VENTOLIN HFA) 108 (90 Base) MCG/ACT inhaler Inhale 1-2 puffs into the lungs every 6 (six) hours as needed for wheezing or shortness of breath. Reported on 05/22/2016    Historical Provider, MD  amLODipine (NORVASC) 10 MG tablet Take 1 tablet (10 mg total) by mouth daily. 06/28/16   Henrietta Hoover, NP  cloNIDine (CATAPRES) 0.1 MG tablet TAKE 1 TABLET BY MOUTH DAILY. 08/25/16   Henrietta Hoover, NP  hydrochlorothiazide (HYDRODIURIL) 25 MG tablet Take 1 tablet (25 mg total) by mouth daily. 06/08/16   Massie Maroon, FNP    hydrochlorothiazide (HYDRODIURIL) 25 MG tablet TAKE 1 TABLET BY MOUTH DAILY. 08/25/16   Henrietta Hoover, NP  sildenafil (VIAGRA) 25 MG tablet Take 1 tablet (25 mg total) by mouth daily as needed for erectile dysfunction. Patient not taking: Reported on 05/22/2016 04/17/16   Quentin Angst, MD  simvastatin (ZOCOR) 20 MG tablet Take 1 tablet (20 mg total) by mouth daily. 06/08/16   Massie Maroon, FNP  varenicline (CHANTIX CONTINUING MONTH PAK) 1 MG tablet Take 1 tablet (1 mg total) by mouth 2 (two) times daily. 05/22/16   Massie Maroon, FNP  varenicline (CHANTIX STARTING MONTH PAK) 0.5 MG X 11 & 1 MG X 42 tablet Take one 0.5 mg tablet by mouth once daily for 3 days, then increase to one 0.5 mg tablet twice daily for 4 days, then increase to one 1 mg tablet twice daily. 05/23/16   Massie Maroon, FNP    Family History Family History  Problem Relation Age of Onset  . Heart attack Father     Social History Social History  Substance Use Topics  . Smoking status: Current Every Day Smoker    Packs/day: 1.50    Years: 37.00    Types: Cigarettes, Cigars  . Smokeless tobacco: Never Used  . Alcohol use No     Allergies   Review of patient's allergies indicates no known allergies.   Review  of Systems Review of Systems ROS reviewed and all are negative for acute change except as noted in the HPI.  Physical Exam Updated Vital Signs BP 121/83 (BP Location: Right Arm)   Pulse 86   Temp 98 F (36.7 C) (Oral)   Resp 22   Ht 5\' 11"  (1.803 m)   Wt 99.8 kg   SpO2 95%   BMI 30.68 kg/m   Physical Exam  Constitutional: He is oriented to person, place, and time. Vital signs are normal. He appears well-developed and well-nourished.  HENT:  Head: Normocephalic.  Right Ear: Hearing normal.  Left Ear: Hearing normal.  Eyes: Conjunctivae and EOM are normal. Pupils are equal, round, and reactive to light.  Neck: Normal range of motion. Neck supple.  Cardiovascular: Normal rate,  regular rhythm, normal heart sounds and intact distal pulses.   Pulmonary/Chest: Effort normal and breath sounds normal.  Musculoskeletal: Normal range of motion.  Left Great Toe with partially avulsed toe nail that is thickened. No purulence noted. Slight erythema. No streaking. No swelling. NVI. Cap refill <2sec. Motor/senastion intact.   Neurological: He is alert and oriented to person, place, and time.  Skin: Skin is warm and dry.  Psychiatric: He has a normal mood and affect. His speech is normal and behavior is normal. Thought content normal.  Nursing note and vitals reviewed.  ED Treatments / Results  Labs (all labs ordered are listed, but only abnormal results are displayed) Labs Reviewed  CBC - Abnormal; Notable for the following:       Result Value   WBC 12.5 (*)    Hemoglobin 18.1 (*)    HCT 52.4 (*)    All other components within normal limits   EKG  EKG Interpretation None      Radiology No results found.  Procedures Procedures (including critical care time)  Medications Ordered in ED Medications - No data to display   Initial Impression / Assessment and Plan / ED Course  I have reviewed the triage vital signs and the nursing notes.  Pertinent labs & imaging results that were available during my care of the patient were reviewed by me and considered in my medical decision making (see chart for details).  Clinical Course   Final Clinical Impressions(s) / ED Diagnoses  I have reviewed the relevant previous healthcare records. I obtained HPI from historian.  ED Course:  Assessment: Pt is a 60yM with hx HTN who presents with left great toe pain x 1 month. On exam, pt in NAD. Nontoxic/nonseptic appearing. VSS. Afebrile. Left great toe with minimal erythema. No purulence. No swelling. No induration/fluctuance palpable. ROM intact with cap refill <2sec. NVI. No co morbidities to affect wound healing. CBC 12.5. Afebrile. Will RX ABX and follow up to Podiatry. Plan  is to DC Home. At time of discharge, Patient is in no acute distress. Vital Signs are stable. Patient is able to ambulate. Patient able to tolerate PO.    Disposition/Plan:  DC Home Additional Verbal discharge instructions given and discussed with patient.  Pt Instructed to f/u with PCP in the next week for evaluation and treatment of symptoms. Return precautions given Pt acknowledges and agrees with plan  Supervising Physician Shon Batonourtney F Horton, MD   Final diagnoses:  Cellulitis of great toe, left    New Prescriptions New Prescriptions   No medications on file     Audry Piliyler Reyes [place], PA-C 09/26/16 [id number]    Shon Batonourtney F Horton, MD 09/27/16 [phone]

## 2016-09-29 MED FILL — $VIAGRA 25 MG TABLET: 25 MG | 30 days supply | Qty: 2 | Fill #2

## 2016-10-12 MED FILL — ?AMLODIPINE BESYLATE 10 MG: 10 | 30 days supply | Qty: 30 | Fill #0

## 2016-10-12 MED FILL — cloNIDine HCL 0.1 MG TABS: 0.1 | 30 days supply | Qty: 30 | Fill #1

## 2016-10-12 MED FILL — HYDROCHLOROTHIAZIDE 25 MG T: 25 | 30 days supply | Qty: 30 | Fill #1

## 2016-10-17 MED FILL — ?SIMVASTATIN 20 MG TABLET: 20 MG | 30 days supply | Qty: 30 | Fill #2

## 2016-10-17 MED FILL — !VIAGRA 25 MG TABLET: 25 | 30 days supply | Qty: 2 | Fill #3

## 2016-11-06 ENCOUNTER — Ambulatory Visit: Payer: No Typology Code available for payment source | Admitting: Family Medicine

## 2016-11-30 MED FILL — HYDROCHLOROTHIAZIDE 25 MG T: 25 | 30 days supply | Qty: 30 | Fill #2

## 2016-11-30 MED FILL — ?SIMVASTATIN 20 MG TABLET: 20 MG | 30 days supply | Qty: 30 | Fill #1

## 2016-11-30 MED FILL — !VIAGRA 25 MG TABLET: 25 | 74 days supply | Qty: 5 | Fill #4

## 2016-11-30 MED FILL — cloNIDine HCL 0.1 MG TABS: 0.1 | 30 days supply | Qty: 30 | Fill #2

## 2016-11-30 MED FILL — ?AMLODIPINE BESYLATE 10 MG: 10 | 30 days supply | Qty: 30 | Fill #1

## 2016-12-26 ENCOUNTER — Ambulatory Visit: Payer: No Typology Code available for payment source | Attending: Family Medicine

## 2016-12-26 MED FILL — !VIAGRA 25 MG TABLET: 25 | 30 days supply | Qty: 10 | Fill #5

## 2017-01-01 ENCOUNTER — Other Ambulatory Visit: Payer: Self-pay | Admitting: *Deleted

## 2017-01-01 DIAGNOSIS — N529 Male erectile dysfunction, unspecified: Secondary | ICD-10-CM

## 2017-01-01 MED ORDER — SILDENAFIL CITRATE 25 MG PO TABS: 25.0000 mg | ORAL_TABLET | Freq: Every day | ORAL | 3 refills | Status: DC | PRN

## 2017-01-01 NOTE — Telephone Encounter (Signed)
PRINTED FOR PASS PROGRAM 

## 2017-01-17 ENCOUNTER — Ambulatory Visit: Payer: No Typology Code available for payment source

## 2017-01-26 ENCOUNTER — Ambulatory Visit: Payer: Self-pay | Attending: Internal Medicine

## 2017-01-26 MED FILL — !VIAGRA 25 MG TABLET: 25 | 30 days supply | Qty: 5 | Fill #6

## 2017-01-26 MED FILL — AMLODIPINE BESYLATE 10 MG T: 10 | 30 days supply | Qty: 30 | Fill #2

## 2017-01-26 MED FILL — HYDROCHLOROTHIAZIDE 25 MG T: 25 | 30 days supply | Qty: 30 | Fill #0

## 2017-01-26 MED FILL — ?SIMVASTATIN 20 MG TABLET: 20 MG | 30 days supply | Qty: 30 | Fill #2

## 2017-01-26 MED FILL — cloNIDine HCL 0.1 MG TABS: 0.1 | 30 days supply | Qty: 30 | Fill #0

## 2017-02-06 ENCOUNTER — Ambulatory Visit: Payer: Self-pay | Attending: Internal Medicine

## 2017-02-20 ENCOUNTER — Ambulatory Visit: Payer: Self-pay | Admitting: Family Medicine

## 2017-02-26 ENCOUNTER — Ambulatory Visit: Payer: Self-pay | Admitting: Family Medicine

## 2017-03-19 MED FILL — $VIAGRA 25 MG TABLET: 25 MG | 30 days supply | Qty: 10 | Fill #0

## 2017-03-19 MED FILL — AMLODIPINE BESYLATE 10 MG T: 10 | 30 days supply | Qty: 30 | Fill #1

## 2017-03-19 MED FILL — HYDROCHLOROTHIAZIDE 25 MG T: 25 | 30 days supply | Qty: 30 | Fill #1

## 2017-03-19 MED FILL — cloNIDine HCL 0.1 MG TABS: 0.1 | 30 days supply | Qty: 30 | Fill #1

## 2017-03-20 ENCOUNTER — Ambulatory Visit: Payer: Self-pay | Admitting: Family Medicine

## 2017-05-01 ENCOUNTER — Other Ambulatory Visit: Payer: Self-pay | Admitting: Family Medicine

## 2017-05-01 DIAGNOSIS — I10 Essential (primary) hypertension: Secondary | ICD-10-CM

## 2017-05-01 DIAGNOSIS — E785 Hyperlipidemia, unspecified: Secondary | ICD-10-CM

## 2017-05-01 MED FILL — SIMVASTATIN 20 MG TABLET: 20 | 30 days supply | Qty: 30 | Fill #0

## 2017-05-01 MED FILL — HYDROCHLOROTHIAZIDE 25 MG T: 25 | 30 days supply | Qty: 30 | Fill #2

## 2017-05-01 MED FILL — $VIAGRA 25 MG TABLET: 25 MG | 30 days supply | Qty: 10 | Fill #1

## 2017-05-01 MED FILL — ?AMLODIPINE BESYLATE 10 MG: 10 | 30 days supply | Qty: 30 | Fill #2

## 2017-05-24 ENCOUNTER — Ambulatory Visit: Payer: Self-pay | Admitting: Family Medicine

## 2017-06-08 ENCOUNTER — Other Ambulatory Visit: Payer: Self-pay | Admitting: Family Medicine

## 2017-06-08 DIAGNOSIS — I10 Essential (primary) hypertension: Secondary | ICD-10-CM

## 2017-06-08 MED FILL — ?AMLODIPINE BESYLATE 10 MG: 10 | 30 days supply | Qty: 30 | Fill #0

## 2017-06-08 MED FILL — $VIAGRA 25 MG TABLET: 25 MG | 30 days supply | Qty: 10 | Fill #2

## 2017-06-08 MED FILL — SIMVASTATIN 20 MG TABLET: 20 | 30 days supply | Qty: 30 | Fill #1

## 2017-06-08 MED FILL — HYDROCHLOROTHIAZIDE 25 MG T: 25 | 30 days supply | Qty: 30 | Fill #0

## 2017-06-11 ENCOUNTER — Encounter: Payer: Self-pay | Admitting: Family Medicine

## 2017-06-11 ENCOUNTER — Ambulatory Visit (INDEPENDENT_AMBULATORY_CARE_PROVIDER_SITE_OTHER): Payer: Self-pay | Admitting: Family Medicine

## 2017-06-11 VITALS — BP 125/87 | HR 87 | Temp 97.5°F | Resp 16 | Ht 71.0 in | Wt 231.0 lb

## 2017-06-11 DIAGNOSIS — I10 Essential (primary) hypertension: Secondary | ICD-10-CM

## 2017-06-11 DIAGNOSIS — E785 Hyperlipidemia, unspecified: Secondary | ICD-10-CM

## 2017-06-11 DIAGNOSIS — F17200 Nicotine dependence, unspecified, uncomplicated: Secondary | ICD-10-CM

## 2017-06-11 DIAGNOSIS — Z114 Encounter for screening for human immunodeficiency virus [HIV]: Secondary | ICD-10-CM

## 2017-06-11 DIAGNOSIS — Z1159 Encounter for screening for other viral diseases: Secondary | ICD-10-CM

## 2017-06-11 LAB — LIPID PANEL
CHOL/HDL RATIO: 3.8 ratio (ref <5.0)
Cholesterol: 179 mg/dL (ref <200)
HDL: 47 mg/dL (ref >40)
LDL CALC: 117 mg/dL — AB (ref <100)
Triglycerides: 75 mg/dL (ref <150)
VLDL: 15 mg/dL (ref <30)

## 2017-06-11 LAB — POCT URINALYSIS DIP (DEVICE)
Bilirubin Urine: NEGATIVE
GLUCOSE, UA: NEGATIVE mg/dL
Hgb urine dipstick: NEGATIVE
KETONES UR: NEGATIVE mg/dL
Leukocytes, UA: NEGATIVE
Nitrite: NEGATIVE
PH: 6 (ref 5.0–8.0)
PROTEIN: NEGATIVE mg/dL
SPECIFIC GRAVITY, URINE: 1.015 (ref 1.005–1.030)
UROBILINOGEN UA: 0.2 mg/dL (ref 0.0–1.0)

## 2017-06-11 LAB — COMPLETE METABOLIC PANEL WITH GFR
ALT: 12 U/L (ref 9–46)
AST: 15 U/L (ref 10–35)
Albumin: 3.9 g/dL (ref 3.6–5.1)
Alkaline Phosphatase: 75 U/L (ref 40–115)
BUN: 14 mg/dL (ref 7–25)
CALCIUM: 9.2 mg/dL (ref 8.6–10.3)
CHLORIDE: 107 mmol/L (ref 98–110)
CO2: 19 mmol/L — ABNORMAL LOW (ref 20–31)
CREATININE: 0.79 mg/dL (ref 0.70–1.33)
GFR, Est African American: >89 mL/min (ref >=60)
GFR, Est Non African American: >89 mL/min (ref >=60)
Glucose, Bld: 89 mg/dL (ref 65–99)
Potassium: 4.2 mmol/L (ref 3.5–5.3)
Sodium: 139 mmol/L (ref 135–146)
Total Bilirubin: 0.4 mg/dL (ref 0.2–1.2)
Total Protein: 6.6 g/dL (ref 6.1–8.1)

## 2017-06-11 MED ORDER — ASPIRIN EC 81 MG PO TBEC: 81.0000 mg | DELAYED_RELEASE_TABLET | Freq: Every day | ORAL | 11 refills | Status: AC

## 2017-06-11 MED ORDER — AMLODIPINE BESYLATE 10 MG PO TABS: 10.0000 mg | ORAL_TABLET | Freq: Every day | ORAL | 5 refills | Status: DC

## 2017-06-11 MED ORDER — SIMVASTATIN 20 MG PO TABS: 20.0000 mg | ORAL_TABLET | Freq: Every day | ORAL | 5 refills | Status: DC

## 2017-06-11 MED ORDER — CLONIDINE HCL 0.1 MG PO TABS: 0.1000 mg | ORAL_TABLET | Freq: Every day | ORAL | 5 refills | Status: DC

## 2017-06-11 MED ORDER — HYDROCHLOROTHIAZIDE 25 MG PO TABS: 25.0000 mg | ORAL_TABLET | Freq: Every day | ORAL | 5 refills | Status: DC

## 2017-06-11 MED FILL — cloNIDine HCL 0.1 MG TABS: 0.1 | 30 days supply | Qty: 30 | Fill #0

## 2017-06-11 NOTE — Progress Notes (Signed)
Subjective:    Patient ID: Cameron Armstrong, male    DOB: [birth year], 61 y.o.   MRN: [id number]  HPI Mr. Neri Samek, a 61 year old male with a history of hypertension and hyperlipidemia that presents for a follow up of chronic conditions. He has been lost to follow up over the past year.   He has continued to take medications consistently. He is not following a low salt, low fat diet or exercising.  Patient denies chest pain, dyspnea, fatigue, irregular heart beat, orthopnea, palpitations, syncope and tachypnea.  Cardiovascular risk factors include: dyslipidemia, obesity (BMI >= 30 kg/m2), sedentary lifestyle and smoking/ tobacco exposure.   Patient is a chronic everyday tobacco smoker. He has a history of emphysema. He is currently asymptomatic. He denies dyspnea, SOB, chest pains, and palpitations. He has been smoking 2 packs per day for greater than 30 years.  Past Medical History:  Diagnosis Date  . Abdominal aneurysm (HCC)   . Emphysema of lung (HCC)   . Hypertension   . Migraine-cluster headache syndrome   . Pulmonary nodule    Past Surgical History:  Procedure Laterality Date  . HERNIA REPAIR    . TONSILLECTOMY    No Known Allergies  Immunization History  Administered Date(s) Administered  . Pneumococcal Polysaccharide-23 01/18/2016  . Tdap 05/10/2014   Review of Systems  Constitutional: Negative.  Negative for fatigue.  HENT: Positive for dental problem.   Eyes: Negative.   Respiratory: Negative.  Negative for apnea, cough, chest tightness and shortness of breath.   Cardiovascular: Negative.   Gastrointestinal: Negative.  Negative for abdominal distention and abdominal pain.  Endocrine: Negative.  Negative for polydipsia, polyphagia and polyuria.  Genitourinary: Negative.        Erectile dysfunction   Musculoskeletal: Negative.   Skin: Negative.   Allergic/Immunologic: Negative.   Neurological: Negative.   Hematological: Negative.   Psychiatric/Behavioral:  Negative.  Negative for sleep disturbance and suicidal ideas.       Objective:   Physical Exam  Constitutional: He is oriented to person, place, and time. He appears well-developed and well-nourished.  HENT:  Head: Normocephalic and atraumatic.  Right Ear: External ear normal.  Left Ear: External ear normal.  Nose: Nose normal.  Mouth/Throat: Oropharynx is clear and moist. Abnormal dentition. Dental caries present.  Partially edentulous  Eyes: Conjunctivae and EOM are normal. Pupils are equal, round, and reactive to light.  Neck: Normal range of motion. Neck supple.  Cardiovascular: Normal rate, regular rhythm, normal heart sounds and intact distal pulses.   Pulmonary/Chest: Effort normal and breath sounds normal.  Abdominal: Soft. Bowel sounds are normal.  Genitourinary: Prostate normal and testes normal. Rectal exam shows anal tone normal. Prostate is not enlarged and not tender. Right testis shows no mass and no tenderness. Left testis shows no mass and no tenderness. Uncircumcised.  Musculoskeletal: Normal range of motion.  Neurological: He is alert and oriented to person, place, and time. He has normal reflexes.  Skin: Skin is warm and dry.  Psychiatric: He has a normal mood and affect. His behavior is normal. Judgment and thought content normal.     BP 125/87 (BP Location: Right Arm, Patient Position: Sitting, Cuff Size: Normal)   Pulse 87   Temp (!) 97.5 F (36.4 C) (Oral)   Resp 16   Ht 5\' 11"  (1.803 m)   Wt 231 lb (104.8 kg)   SpO2 97%   BMI 32.22 kg/m  Assessment & Plan:  1. Essential hypertension Blood  pressure is at goal on current medication regimen. Reviewed urinalysis, no proteinuria present.  - COMPLETE METABOLIC PANEL WITH GFR - amLODipine (NORVASC) 10 MG tablet; Take 1 tablet (10 mg total) by mouth daily.  Dispense: 30 tablet; Refill: 5 - hydrochlorothiazide (HYDRODIURIL) 25 MG tablet; Take 1 tablet (25 mg total) by mouth daily.  Dispense: 30 tablet; Refill:  5 - cloNIDine (CATAPRES) 0.1 MG tablet; Take 1 tablet (0.1 mg total) by mouth daily.  Dispense: 30 tablet; Refill: 5 - POCT urinalysis dip (device)  2. Hyperlipidemia, unspecified hyperlipidemia type The patient is asked to make an attempt to improve diet and exercise patterns to aid in medical management of this problem.  - simvastatin (ZOCOR) 20 MG tablet; Take 1 tablet (20 mg total) by mouth daily.  Dispense: 30 tablet; Refill: 5 - aspirin EC 81 MG tablet; Take 1 tablet (81 mg total) by mouth daily.  Dispense: 30 tablet; Refill: 11 - Lipid Panel  3. Need for hepatitis C screening test - Hepatitis C Antibody  4. Screening for HIV (human immunodeficiency virus)  - HIV antibody (with reflex)  5. Tobacco dependence Smoking cessation instruction/counseling given:  counseled patient on the dangers of tobacco use, advised patient to stop smoking, and reviewed strategies to maximize success    Preventative maintenance:  Recommend smoking cessation Recommend a lowfat, low carbohydrate diet divided over 5-6 small meals, increase water intake to 6-8 glasses, and 150 minutes per week of cardiovascular exercise.  Recommend a colonoscopy History of abdominal aortic aneurysm, which was 3.4 cm. He is at a low risk of rupture. I recommend smoking cessation. Also, will continue statin and ASA therapy.    Nolon NationsLaChina Moore Hollis  MSN, FNP-C [facility] [address]  [place], [place] [id number] [phone]

## 2017-06-11 NOTE — Patient Instructions (Addendum)
Blood pressure is at goal, no changes to regimen. [facility]   History of small abdominal aortic aneurysm:  The risk of aneurysm enlargement may be diminished with attention to the patient's blood pressure, smoking and cholesterol levels.   DASH Eating Plan DASH stands for "Dietary Approaches to Stop Hypertension." The DASH eating plan is a healthy eating plan that has been shown to reduce high blood pressure (hypertension). It may also reduce your risk for type 2 diabetes, heart disease, and stroke. The DASH eating plan may also help with weight loss. What are tips for following this plan? General guidelines  Avoid eating more than 2,300 mg (milligrams) of salt (sodium) a day. If you have hypertension, you may need to reduce your sodium intake to 1,500 mg a day.  Limit alcohol intake to no more than 1 drink a day for nonpregnant women and 2 drinks a day for men. One drink equals 12 oz of beer, 5 oz of wine, or 1 oz of hard liquor.  Work with your health care provider to maintain a healthy body weight or to lose weight. Ask what an ideal weight is for you.  Get at least 30 minutes of exercise that causes your heart to beat faster (aerobic exercise) most days of the week. Activities may include walking, swimming, or biking.  Work with your health care provider or diet and nutrition specialist (dietitian) to adjust your eating plan to your individual calorie needs. Reading food labels  Check food labels for the amount of sodium per serving. Choose foods with less than 5 percent of the Daily Value of sodium. Generally, foods with less than 300 mg of sodium per serving fit into this eating plan.  To find whole grains, look for the word "whole" as the first word in the ingredient list. Shopping  Buy products labeled as "low-sodium" or "no salt added."  Buy fresh foods. Avoid canned foods and premade or frozen meals. Cooking  Avoid adding salt  when cooking. Use salt-free seasonings or herbs instead of table salt or sea salt. Check with your health care provider or pharmacist before using salt substitutes.  Do not fry foods. Cook foods using healthy methods such as baking, boiling, grilling, and broiling instead.  Cook with heart-healthy oils, such as olive, canola, soybean, or sunflower oil. Meal planning   Eat a balanced diet that includes: ? 5 or more servings of fruits and vegetables each day. At each meal, try to fill half of your plate with fruits and vegetables. ? Up to 6-8 servings of whole grains each day. ? Less than 6 oz of lean meat, poultry, or fish each day. A 3-oz serving of meat is about the same size as a deck of cards. One egg equals 1 oz. ? 2 servings of low-fat dairy each day. ? A serving of nuts, seeds, or beans 5 times each week. ? Heart-healthy fats. Healthy fats called Omega-3 fatty acids are found in foods such as flaxseeds and coldwater fish, like sardines, salmon, and mackerel.  Limit how much you eat of the following: ? Canned or prepackaged foods. ? Food that is high in trans fat, such as fried foods. ? Food that is high in saturated fat, such as fatty meat. ? Sweets, desserts, sugary drinks, and other foods with added sugar. ? Full-fat dairy products.  Do not salt foods before eating.  Try to eat at least 2 vegetarian meals each week.  Eat more  home-cooked food and less restaurant, buffet, and fast food.  When eating at a restaurant, ask that your food be prepared with less salt or no salt, if possible. What foods are recommended? The items listed may not be a complete list. Talk with your dietitian about what dietary choices are best for you. Grains Whole-grain or whole-wheat bread. Whole-grain or whole-wheat pasta. Brown rice. Orpah Cobb. Bulgur. Whole-grain and low-sodium cereals. Pita bread. Low-fat, low-sodium crackers. Whole-wheat flour tortillas. Vegetables Fresh or frozen  vegetables (raw, steamed, roasted, or grilled). Low-sodium or reduced-sodium tomato and vegetable juice. Low-sodium or reduced-sodium tomato sauce and tomato paste. Low-sodium or reduced-sodium canned vegetables. Fruits All fresh, dried, or frozen fruit. Canned fruit in natural juice (without added sugar). Meat and other protein foods Skinless chicken or [place]. Ground chicken or [place]. Pork with fat trimmed off. Fish and seafood. Egg whites. Dried beans, peas, or lentils. Unsalted nuts, nut butters, and seeds. Unsalted canned beans. Lean cuts of beef with fat trimmed off. Low-sodium, lean deli meat. Dairy Low-fat (1%) or fat-free (skim) milk. Fat-free, low-fat, or reduced-fat cheeses. Nonfat, low-sodium ricotta or cottage cheese. Low-fat or nonfat yogurt. Low-fat, low-sodium cheese. Fats and oils Soft margarine without trans fats. Vegetable oil. Low-fat, reduced-fat, or light mayonnaise and salad dressings (reduced-sodium). Canola, safflower, olive, soybean, and sunflower oils. Avocado. Seasoning and other foods Herbs. Spices. Seasoning mixes without salt. Unsalted popcorn and pretzels. Fat-free sweets. What foods are not recommended? The items listed may not be a complete list. Talk with your dietitian about what dietary choices are best for you. Grains Baked goods made with fat, such as croissants, muffins, or some breads. Dry pasta or rice meal packs. Vegetables Creamed or fried vegetables. Vegetables in a cheese sauce. Regular canned vegetables (not low-sodium or reduced-sodium). Regular canned tomato sauce and paste (not low-sodium or reduced-sodium). Regular tomato and vegetable juice (not low-sodium or reduced-sodium). Rosita Fire. Olives. Fruits Canned fruit in a light or heavy syrup. Fried fruit. Fruit in cream or butter sauce. Meat and other protein foods Fatty cuts of meat. Ribs. Fried meat. Tomasa Blase. Sausage. Bologna and other processed lunch meats. Salami. Fatback. Hotdogs. Bratwurst.  Salted nuts and seeds. Canned beans with added salt. Canned or smoked fish. Whole eggs or egg yolks. Chicken or [place] with skin. Dairy Whole or 2% milk, cream, and half-and-half. Whole or full-fat cream cheese. Whole-fat or sweetened yogurt. Full-fat cheese. Nondairy creamers. Whipped toppings. Processed cheese and cheese spreads. Fats and oils Butter. Stick margarine. Lard. Shortening. Ghee. Bacon fat. Tropical oils, such as coconut, palm kernel, or palm oil. Seasoning and other foods Salted popcorn and pretzels. Onion salt, garlic salt, seasoned salt, table salt, and sea salt. Worcestershire sauce. Tartar sauce. Barbecue sauce. Teriyaki sauce. Soy sauce, including reduced-sodium. Steak sauce. Canned and packaged gravies. Fish sauce. Oyster sauce. Cocktail sauce. Horseradish that you find on the shelf. Ketchup. Mustard. Meat flavorings and tenderizers. Bouillon cubes. Hot sauce and Tabasco sauce. Premade or packaged marinades. Premade or packaged taco seasonings. Relishes. Regular salad dressings. Where to find more information:  National Heart, Lung, and Blood Institute: PopSteam.is  American Heart Association: www.heart.org Summary  The DASH eating plan is a healthy eating plan that has been shown to reduce high blood pressure (hypertension). It may also reduce your risk for type 2 diabetes, heart disease, and stroke.  With the DASH eating plan, you should limit salt (sodium) intake to 2,300 mg a day. If you have hypertension, you may need to reduce your sodium intake to 1,500  mg a day.  When on the DASH eating plan, aim to eat more fresh fruits and vegetables, whole grains, lean proteins, low-fat dairy, and heart-healthy fats.  Work with your health care provider or diet and nutrition specialist (dietitian) to adjust your eating plan to your individual calorie needs. This information is not intended to replace advice given to you by your health care provider. Make sure you discuss any  questions you have with your health care provider. Document Released: 11/09/2011 Document Revised: 11/13/2016 Document Reviewed: 11/13/2016 Elsevier Interactive Patient Education  2017 Elsevier Inc. Fat and Cholesterol Restricted Diet High levels of fat and cholesterol in your blood may lead to various health problems, such as diseases of the heart, blood vessels, gallbladder, liver, and pancreas. Fats are concentrated sources of energy that come in various forms. Certain types of fat, including saturated fat, may be harmful in excess. Cholesterol is a substance needed by your body in small amounts. Your body makes all the cholesterol it needs. Excess cholesterol comes from the food you eat. When you have high levels of cholesterol and saturated fat in your blood, health problems can develop because the excess fat and cholesterol will gather along the walls of your blood vessels, causing them to narrow. Choosing the right foods will help you control your intake of fat and cholesterol. This will help keep the levels of these substances in your blood within normal limits and reduce your risk of disease. What is my plan? Your health care provider recommends that you:  Limit your fat intake to ______% or less of your total calories per day.  Limit the amount of cholesterol in your diet to less than _________mg per day.  Eat 20-30 grams of fiber each day.  What types of fat should I choose?  Choose healthy fats more often. Choose monounsaturated and polyunsaturated fats, such as olive and canola oil, flaxseeds, walnuts, almonds, and seeds.  Eat more omega-3 fats. Good choices include salmon, mackerel, sardines, tuna, flaxseed oil, and ground flaxseeds. Aim to eat fish at least two times a week.  Limit saturated fats. Saturated fats are primarily found in animal products, such as meats, butter, and cream. Plant sources of saturated fats include palm oil, palm kernel oil, and coconut oil.  Avoid  foods with partially hydrogenated oils in them. These contain trans fats. Examples of foods that contain trans fats are stick margarine, some tub margarines, cookies, crackers, and other baked goods. What general guidelines do I need to follow? These guidelines for healthy eating will help you control your intake of fat and cholesterol:  Check food labels carefully to identify foods with trans fats or high amounts of saturated fat.  Fill one half of your plate with vegetables and green salads.  Fill one fourth of your plate with whole grains. Look for the word "whole" as the first word in the ingredient list.  Fill one fourth of your plate with lean protein foods.  Limit fruit to two servings a day. Choose fruit instead of juice.  Eat more foods that contain fiber, such as apples, broccoli, carrots, beans, peas, and barley.  Eat more home-cooked food and less restaurant, buffet, and fast food.  Limit or avoid alcohol.  Limit foods high in starch and sugar.  Limit fried foods.  Cook foods using methods other than frying. Baking, boiling, grilling, and broiling are all great options.  Lose weight if you are overweight. Losing just 5-10% of your initial body weight can help your  overall health and prevent diseases such as diabetes and heart disease.  What foods can I eat? Grains  Whole grains, such as whole wheat or whole grain breads, crackers, cereals, and pasta. Unsweetened oatmeal, bulgur, barley, quinoa, or brown rice. Corn or whole wheat flour tortillas. Vegetables  Fresh or frozen vegetables (raw, steamed, roasted, or grilled). Green salads. Fruits  All fresh, canned (in natural juice), or frozen fruits. Meats and other protein foods  Ground beef (85% or leaner), grass-fed beef, or beef trimmed of fat. Skinless chicken or [place]. Ground chicken or [place]. Pork trimmed of fat. All fish and seafood. Eggs. Dried beans, peas, or lentils. Unsalted nuts or seeds. Unsalted canned  or dry beans. Dairy  Low-fat dairy products, such as skim or 1% milk, 2% or reduced-fat cheeses, low-fat ricotta or cottage cheese, or plain low-fat yo Fats and oils  Tub margarines without trans fats. Light or reduced-fat mayonnaise and salad dressings. Avocado. Olive, canola, sesame, or safflower oils. Natural peanut or almond butter (choose ones without added sugar and oil). The items listed above may not be a complete list of recommended foods or beverages. Contact your dietitian for more options. Foods to avoid Grains  White bread. White pasta. White rice. Cornbread. Bagels, pastries, and croissants. Crackers that contain trans fat. Vegetables  White potatoes. Corn. Creamed or fried vegetables. Vegetables in a cheese sauce. Fruits  Dried fruits. Canned fruit in light or heavy syrup. Fruit juice. Meats and other protein foods  Fatty cuts of meat. Ribs, chicken wings, bacon, sausage, bologna, salami, chitterlings, fatback, hot dogs, bratwurst, and packaged luncheon meats. Liver and organ meats. Dairy  Whole or 2% milk, cream, half-and-half, and cream cheese. Whole milk cheeses. Whole-fat or sweetened yogurt. Full-fat cheeses. Nondairy creamers and whipped toppings. Processed cheese, cheese spreads, or cheese curds. Beverages  Alcohol. Sweetened drinks (such as sodas, lemonade, and fruit drinks or punches). Fats and oils  Butter, stick margarine, lard, shortening, ghee, or bacon fat. Coconut, palm kernel, or palm oils. Sweets and desserts  Corn syrup, sugars, honey, and molasses. Candy. Jam and jelly. Syrup. Sweetened cereals. Cookies, pies, cakes, donuts, muffins, and ice cream. The items listed above may not be a complete list of foods and beverages to avoid. Contact your dietitian for more information. This information is not intended to replace advice given to you by your health care provider. Make sure you discuss any questions you have with your health care  provider. Document Released: 11/20/2005 Document Revised: 12/11/2014 Document Reviewed: 02/18/2014 Elsevier Interactive Patient Education  2017 [employer].

## 2017-06-12 LAB — HIV ANTIBODY (ROUTINE TESTING W REFLEX): HIV 1&2 Ab, 4th Generation: NONREACTIVE

## 2017-06-12 LAB — HEPATITIS C ANTIBODY: HCV AB: NEGATIVE

## 2017-06-18 ENCOUNTER — Telehealth: Payer: Self-pay | Admitting: Family Medicine

## 2017-06-20 ENCOUNTER — Ambulatory Visit: Payer: Self-pay

## 2017-06-21 ENCOUNTER — Telehealth: Payer: Self-pay | Admitting: Family Medicine

## 2017-06-21 NOTE — Telephone Encounter (Signed)
Unable to leave message

## 2017-06-25 ENCOUNTER — Ambulatory Visit: Payer: Self-pay | Attending: Internal Medicine

## 2017-07-17 ENCOUNTER — Telehealth: Payer: Self-pay

## 2017-07-17 NOTE — Telephone Encounter (Signed)
Patient called today saying he is approved for the [facility] and that at his last appointment he discussed having an imaging study done to follow up on his aneurism. Can this be ordered now? Please advise. Thanks!

## 2017-07-20 ENCOUNTER — Other Ambulatory Visit: Payer: Self-pay | Admitting: Family Medicine

## 2017-07-23 ENCOUNTER — Other Ambulatory Visit: Payer: Self-pay | Admitting: Family Medicine

## 2017-07-23 DIAGNOSIS — I714 Abdominal aortic aneurysm, without rupture, unspecified: Secondary | ICD-10-CM

## 2017-07-23 DIAGNOSIS — R935 Abnormal findings on diagnostic imaging of other abdominal regions, including retroperitoneum: Secondary | ICD-10-CM

## 2017-07-23 DIAGNOSIS — F17200 Nicotine dependence, unspecified, uncomplicated: Secondary | ICD-10-CM

## 2017-07-24 MED FILL — HYDROCHLOROTHIAZIDE 25 MG T: 25 | 30 days supply | Qty: 30 | Fill #1

## 2017-07-24 MED FILL — cloNIDine HCL 0.1 MG TABS: 0.1 | 30 days supply | Qty: 30 | Fill #1

## 2017-07-24 MED FILL — SIMVASTATIN 20 MG TABLET: 20 | 30 days supply | Qty: 30 | Fill #2

## 2017-07-24 MED FILL — AMLODIPINE BESYLATE 10 MG T: 10 | 30 days supply | Qty: 30 | Fill #1

## 2017-07-24 MED FILL — $VIAGRA 25 MG TABLET: 25 MG | 30 days supply | Qty: 10 | Fill #3

## 2017-07-24 NOTE — Progress Notes (Signed)
This appointment has been scheduled for 08/01/2017 @9 :00am and patient has been made aware. Thanks!

## 2017-07-30 ENCOUNTER — Other Ambulatory Visit: Payer: Self-pay

## 2017-07-30 DIAGNOSIS — I714 Abdominal aortic aneurysm, without rupture, unspecified: Secondary | ICD-10-CM

## 2017-08-01 ENCOUNTER — Ambulatory Visit (HOSPITAL_COMMUNITY)
Admission: RE | Admit: 2017-08-01 | Discharge: 2017-08-01 | Disposition: A | Discharge status: Home / Self Care | Payer: Self-pay | Source: Ambulatory Visit | Attending: Family Medicine | Admitting: Family Medicine

## 2017-08-01 DIAGNOSIS — I714 Abdominal aortic aneurysm, without rupture, unspecified: Secondary | ICD-10-CM

## 2017-08-02 ENCOUNTER — Telehealth: Payer: Self-pay | Admitting: Family Medicine

## 2017-08-02 DIAGNOSIS — I714 Abdominal aortic aneurysm, without rupture, unspecified: Secondary | ICD-10-CM

## 2017-08-02 NOTE — Telephone Encounter (Signed)
[name] contact patient to advise that the recent Cameron of his Armstrong confirmed the presence of 4.6 cm abdominal aortic aneurysm. He has been referred to vascular surgery for consultation and follow-up. It is also recommended that he obtain a CT angiogram of the abdomen and pelvis in 6 months.   [name] I am placing the referral for vascular surgery please follow-up on referral and ensure the patient obtains an appointment.  Cameron Armstrong  Result Date: 08/01/2017 CLINICAL DATA:  Abdominal aortic aneurysm EXAM: ULTRASOUND OF ABDOMINAL Armstrong TECHNIQUE: Ultrasound examination of the abdominal Armstrong was performed to evaluate for abdominal aortic aneurysm. COMPARISON:  05/08/2012 FINDINGS: Abdominal aortic measurements as follows: Proximal:  2.7 cm cm Mid:  3.0 cm cm Distal:  4.6 cm cm IMPRESSION: 4.6 cm abdominal aortic aneurysm distally. It is fusiform in nature. Recommend followup by abdomen and pelvis CTA in 6 months, and vascular surgery referral/consultation if not already obtained. This recommendation follows ACR consensus guidelines: White Paper of the ACR Incidental Findings Committee II on Vascular Findings. J Am Coll Radiol 2013; 10:789-794. Electronically Signed   By: Alcide CleverMark  Lukens M.D.   On: 08/01/2017 11:46

## 2017-08-03 NOTE — Telephone Encounter (Signed)
Called and spoke with patient. Advised that recent Scan of his Aorta confirmed 4.6cm abdominal aortic aneurysm, and that we are referring him to vascular surgery for consultation and follow up. Advised that it is recommended that he have a CT angiogram of abd/pelvis in 6 months.

## 2017-08-29 ENCOUNTER — Other Ambulatory Visit: Payer: Self-pay | Admitting: Family Medicine

## 2017-08-29 ENCOUNTER — Encounter: Payer: Self-pay | Admitting: Vascular Surgery

## 2017-08-29 ENCOUNTER — Ambulatory Visit (INDEPENDENT_AMBULATORY_CARE_PROVIDER_SITE_OTHER): Payer: Self-pay | Admitting: Vascular Surgery

## 2017-08-29 VITALS — BP 139/91 | HR 102 | Temp 97.0°F | Ht 71.0 in | Wt 232.0 lb

## 2017-08-29 DIAGNOSIS — I714 Abdominal aortic aneurysm, without rupture, unspecified: Secondary | ICD-10-CM

## 2017-08-29 DIAGNOSIS — E785 Hyperlipidemia, unspecified: Secondary | ICD-10-CM

## 2017-08-29 MED FILL — AMLODIPINE BESYLATE 10 MG T: 10 | 30 days supply | Qty: 30 | Fill #2

## 2017-08-29 MED FILL — $VIAGRA 25 MG TABLET: 25 MG | 30 days supply | Qty: 10 | Fill #4

## 2017-08-29 MED FILL — HYDROCHLOROTHIAZIDE 25 MG T: 25 | 30 days supply | Qty: 30 | Fill #2

## 2017-08-29 MED FILL — cloNIDine HCL 0.1 MG TABS: 0.1 | 30 days supply | Qty: 30 | Fill #2

## 2017-08-29 MED FILL — SIMVASTATIN 20 MG TABLET: 20 | 30 days supply | Qty: 30 | Fill #0

## 2017-08-29 NOTE — Progress Notes (Signed)
Patient name: Cameron Armstrong MRN: [id number] DOB: December 06, 1955 Sex: male   REASON FOR CONSULT:    4.6 cm infrarenal abdominal aortic aneurysm. The consult is requested by Joaquin Courts, NP.   HPI:   Cameron Armstrong is a pleasant 61 y.o. male, who was referred for evaluation of an abdominal aortic aneurysm.  I reviewed the office note from 06/11/2017. The patient was seen for routine follow up visit of their hypertension and hyperlipidemia. At the time of that visit the blood pressure was under good control as was the hyperlipidemia. The patient had a history of an abdominal aortic aneurysm that was 3.4 cm in the past it was set up for a follow up ultrasound. Ultrasound showed a 4.6 cm infrarenal abdominal aortic aneurysm the patient was referred for vascular consultation.  The patient denies any abdominal pain or back pain. He denies any family history of aneurysmal disease.  He is a smoker and smokes 1-1/2 packs per day. He isn't smoking for 40 years. He tried to quit on multiple occasions.  He denies any history of claudication, rest pain, or nonhealing ulcers.  He does have some history of emphysema and admits to some dyspnea on exertion. The symptoms have been stable.  Past Medical History:  Diagnosis Date  . Abdominal aneurysm (HCC)   . Emphysema of lung (HCC)   . Hypertension   . Migraine-cluster headache syndrome   . Pulmonary nodule     Family History  Problem Relation Age of Onset  . Heart attack Father     SOCIAL HISTORY: Social History   Social History  . Marital status: Divorced    Spouse name: N/A  . Number of children: N/A  . Years of education: N/A   Occupational History  . Unemployed    Social History Main Topics  . Smoking status: Current Every Day Smoker    Packs/day: 1.50    Years: 37.00    Types: Cigarettes, Cigars  . Smokeless tobacco: Never Used  . Alcohol use No  . Drug use: No  . Sexual activity: Not on file   Other Topics Concern    . Not on file   Social History Narrative  . No narrative on file    No Known Allergies  Current Outpatient Prescriptions  Medication Sig Dispense Refill  . amLODipine (NORVASC) 10 MG tablet Take 1 tablet (10 mg total) by mouth daily. 30 tablet 5  . aspirin EC 81 MG tablet Take 1 tablet (81 mg total) by mouth daily. 30 tablet 11  . cloNIDine (CATAPRES) 0.1 MG tablet Take 1 tablet (0.1 mg total) by mouth daily. 30 tablet 5  . hydrochlorothiazide (HYDRODIURIL) 25 MG tablet Take 1 tablet (25 mg total) by mouth daily. 30 tablet 5  . sildenafil (VIAGRA) 25 MG tablet Take 1 tablet (25 mg total) by mouth daily as needed for erectile dysfunction. 30 tablet 3  . simvastatin (ZOCOR) 20 MG tablet Take 1 tablet (20 mg total) by mouth daily. 30 tablet 5  . simvastatin (ZOCOR) 20 MG tablet TAKE 1 TABLET BY MOUTH DAILY. 30 tablet 2  . varenicline (CHANTIX CONTINUING MONTH PAK) 1 MG tablet Take 1 tablet (1 mg total) by mouth 2 (two) times daily. 60 tablet 1   No current facility-administered medications for this visit.     REVIEW OF SYSTEMS:   denotes positive finding,  denotes negative finding Cardiac  Comments:  Chest pain or chest pressure:    Shortness of breath  upon exertion: X   Short of breath when lying flat:    Irregular heart rhythm:        Vascular    Pain in calf, thigh, or hip brought on by ambulation:    Pain in feet at night that wakes you up from your sleep:     Blood clot in your veins:    Leg swelling:         Pulmonary    Oxygen at home:    Productive cough:     Wheezing:         Neurologic    Sudden weakness in arms or legs:     Sudden numbness in arms or legs:     Sudden onset of difficulty speaking or slurred speech:    Temporary loss of vision in one eye:     Problems with dizziness:         Gastrointestinal    Blood in stool:     Vomited blood:         Genitourinary    Burning when urinating:     Blood in urine:        Psychiatric    Major  depression:         Hematologic    Bleeding problems:    Problems with blood clotting too easily:        Skin    Rashes or ulcers:        Constitutional    Fever or chills:     PHYSICAL EXAM:   Vitals:   08/29/17 1502  BP: (!) 139/91  Pulse: (!) 102  Temp: (!) 97 F (36.1 C)  TempSrc: Oral  SpO2: 95%  Weight: 232 lb (105.2 kg)  Height:  (1.803 m)    GENERAL: The patient is a well-nourished male, in no acute distress. The vital signs are documented above. CARDIAC: There is a regular rate and rhythm.  VASCULAR: I do not detect carotid bruits. He has palpable femoral, popliteal, and pedal pulses bilaterally. He has mild bilateral lower extremity swelling. PULMONARY: There is good air exchange bilaterally without wheezing or rales. ABDOMEN: Soft and non-tender with normal pitched bowel sounds. His aneurysm is palpable and nontender. MUSCULOSKELETAL: There are no major deformities or cyanosis. NEUROLOGIC: No focal weakness or paresthesias are detected. SKIN: There are no ulcers or rashes noted. PSYCHIATRIC: The patient has a normal affect.  DATA:    DUPLEX OF ABDOMINAL AORTA: I have reviewed the duplex of the abdominal aorta that was done on 08/01/2017. This showed that the maximum diameter of the infrarenal aorta was 4.6 cm.  CT ANGIO ABDOMEN PELVIS: I reviewed the CT scan from 05/08/2012. At that time, the aneurysm measured 3.4 cm in maximum diameter.  MEDICAL ISSUES:   4.6 CM INFRARENAL ABDOMINAL AORTIC ANEURYSM: The patient has a 4.6 cm infrarenal abdominal aortic aneurysm. 5 years ago, on 05/08/2012, it was 3.4 cm in maximum diameter. Thus it has only enlarged 1.2 cm in 5 years. I've explained that in a normal risk patient we would consider elective repair at 5.5 cm. I have had a long discussion with him about the importance of tobacco cessation. I spent more than 3 minutes on this conversation. We have also discussed the importance of blood pressure control. I  have ordered a follow up ultrasound in 6 months and I'll see him back at that time. Based on his CT scan in 2013, and my review of the images, he is likely a candidate  for an endovascular approach. If the aneurysm enlarges and we are considering repair then obviously we will need to repeat his CT scan. However for now, I think that it is safe to follow this with ultrasound. I've also discussed with him the importance of getting to the emergency department if he has any acute onset abdominal pain or back pain and be sure that he makes the providers aware that he does have a history of an aneurysm.  Waverly Ferrari [facility] [phone]

## 2017-08-30 NOTE — Addendum Note (Signed)
Addended by: Burton Apley A on: 08/30/2017 04:27 PM   Modules accepted: Orders

## 2017-10-23 MED FILL — AMLODIPINE BESYLATE 10 MG T: 10 | 30 days supply | Qty: 30 | Fill #0

## 2017-10-23 MED FILL — HYDROCHLOROTHIAZIDE 25 MG T: 25 | 30 days supply | Qty: 30 | Fill #0

## 2017-10-23 MED FILL — SIMVASTATIN 20 MG TABLET: 20 | 30 days supply | Qty: 30 | Fill #1

## 2017-10-23 MED FILL — $VIAGRA 25 MG TABLET: 25 MG | 30 days supply | Qty: 10 | Fill #5

## 2017-10-23 MED FILL — ?CLONIDINE HCL 0.1 MG TABL: 0.1 | 30 days supply | Qty: 30 | Fill #3

## 2017-11-05 ENCOUNTER — Ambulatory Visit: Payer: Self-pay

## 2017-12-05 MED FILL — ?CLONIDINE HCL 0.1 MG TABL: 0.1 | 30 days supply | Qty: 30 | Fill #4

## 2017-12-05 MED FILL — SIMVASTATIN 20 MG TABLET: 20 | 30 days supply | Qty: 30 | Fill #2

## 2017-12-05 MED FILL — $VIAGRA 25 MG TABLET: 25 MG | 30 days supply | Qty: 10 | Fill #6

## 2017-12-05 MED FILL — HYDROCHLOROTHIAZIDE 25 MG T: 25 | 30 days supply | Qty: 30 | Fill #1

## 2017-12-05 MED FILL — AMLODIPINE BESYLATE 10 MG T: 10 | 30 days supply | Qty: 30 | Fill #1

## 2017-12-12 ENCOUNTER — Ambulatory Visit: Payer: Self-pay | Admitting: Family Medicine

## 2017-12-16 ENCOUNTER — Other Ambulatory Visit: Payer: Self-pay

## 2017-12-16 ENCOUNTER — Encounter (HOSPITAL_COMMUNITY): Payer: Self-pay | Admitting: *Deleted

## 2017-12-16 ENCOUNTER — Emergency Department (HOSPITAL_COMMUNITY)
Admission: EM | Admit: 2017-12-16 | Discharge: 2017-12-16 | Disposition: A | Discharge status: Home / Self Care | Payer: No Typology Code available for payment source | Attending: Emergency Medicine | Admitting: Emergency Medicine

## 2017-12-16 DIAGNOSIS — I10 Essential (primary) hypertension: Secondary | ICD-10-CM | POA: Diagnosis not present

## 2017-12-16 DIAGNOSIS — F17210 Nicotine dependence, cigarettes, uncomplicated: Secondary | ICD-10-CM | POA: Diagnosis not present

## 2017-12-16 DIAGNOSIS — Z041 Encounter for examination and observation following transport accident: Secondary | ICD-10-CM | POA: Diagnosis not present
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
## 2017-12-16 NOTE — Discharge Instructions (Signed)
Return if any problems.
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
## 2017-12-26 ENCOUNTER — Ambulatory Visit: Payer: Self-pay | Admitting: Family Medicine

## 2018-01-08 MED FILL — AMLODIPINE BESYLATE 10 MG T: 10 | 30 days supply | Qty: 30 | Fill #2

## 2018-01-08 MED FILL — SIMVASTATIN 20 MG TABLET: 20 | 30 days supply | Qty: 30 | Fill #0

## 2018-01-08 MED FILL — HYDROCHLOROTHIAZIDE 25 MG T: 25 | 30 days supply | Qty: 30 | Fill #2

## 2018-01-08 MED FILL — ?CLONIDINE HCL 0.1 MG TABL: 0.1 | 30 days supply | Qty: 30 | Fill #5

## 2018-02-20 ENCOUNTER — Other Ambulatory Visit: Payer: Self-pay | Admitting: Family Medicine

## 2018-02-20 DIAGNOSIS — I10 Essential (primary) hypertension: Secondary | ICD-10-CM

## 2018-02-20 MED FILL — SIMVASTATIN 20 MG TABLET: 20 | 30 days supply | Qty: 30 | Fill #1

## 2018-02-20 MED FILL — HYDROCHLOROTHIAZIDE 25 MG T: 25 | 30 days supply | Qty: 30 | Fill #3

## 2018-02-20 MED FILL — AMLODIPINE BESYLATE 10 MG T: 10 | 30 days supply | Qty: 30 | Fill #3

## 2018-02-21 MED FILL — ?CLONIDINE HCL 0.1 MG TABL: 0.1 | 30 days supply | Qty: 30 | Fill #0

## 2018-02-27 ENCOUNTER — Ambulatory Visit (INDEPENDENT_AMBULATORY_CARE_PROVIDER_SITE_OTHER): Payer: Self-pay | Admitting: Vascular Surgery

## 2018-02-27 ENCOUNTER — Ambulatory Visit (HOSPITAL_COMMUNITY)
Admission: RE | Admit: 2018-02-27 | Discharge: 2018-02-27 | Disposition: A | Discharge status: Home / Self Care | Payer: Self-pay | Source: Ambulatory Visit | Attending: Vascular Surgery | Admitting: Vascular Surgery

## 2018-02-27 ENCOUNTER — Encounter: Payer: Self-pay | Admitting: Vascular Surgery

## 2018-02-27 ENCOUNTER — Other Ambulatory Visit: Payer: Self-pay

## 2018-02-27 VITALS — BP 136/89 | HR 80 | Resp 20 | Ht 71.0 in | Wt 264.9 lb

## 2018-02-27 DIAGNOSIS — I714 Abdominal aortic aneurysm, without rupture, unspecified: Secondary | ICD-10-CM
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
## 2018-02-28 ENCOUNTER — Ambulatory Visit: Payer: Self-pay | Attending: Family Medicine

## 2018-03-01 ENCOUNTER — Other Ambulatory Visit: Payer: Self-pay

## 2018-03-01 DIAGNOSIS — I714 Abdominal aortic aneurysm, without rupture, unspecified: Secondary | ICD-10-CM

## 2018-04-11 MED FILL — ?SIMVASTATIN 20MG TABS: 20 | 30 days supply | Qty: 30 | Fill #2

## 2018-04-11 MED FILL — AMLODIPINE BESYLATE 10 MG T: 10 | 30 days supply | Qty: 30 | Fill #4

## 2018-04-11 MED FILL — ?CLONIDINE HCL 0.1 MG TABL: 0.1 | 30 days supply | Qty: 30 | Fill #1

## 2018-04-11 MED FILL — HYDROCHLOROTHIAZIDE 25 MG T: 25 | 30 days supply | Qty: 30 | Fill #4

## 2018-05-27 MED FILL — HYDROCHLOROTHIAZIDE 25 MG T: 25 | 30 days supply | Qty: 30 | Fill #5

## 2018-05-27 MED FILL — ?CLONIDINE HCL 0.1 MG TABL: 0.1 | 30 days supply | Qty: 30 | Fill #2

## 2018-05-27 MED FILL — ?SIMVASTATIN 20MG TABLETS: 20 | 30 days supply | Qty: 30 | Fill #3

## 2018-05-27 MED FILL — AMLODIPINE BESYLATE 10 MG T: 10 | 30 days supply | Qty: 30 | Fill #5

## 2018-05-28 IMAGING — US US AORTA
1 series · 14 of 23 positions shown · non-contrast
Comparison: 05/08/2012

CLINICAL DATA: Abdominal aortic aneurysm

EXAM:
ULTRASOUND OF ABDOMINAL AORTA
TECHNIQUE: Ultrasound examination of the abdominal aorta was performed to
evaluate for abdominal aortic aneurysm.

[Series 1: us aorta · 0.34mm/px · 14 of 23 slices shown]
[im 1/23]
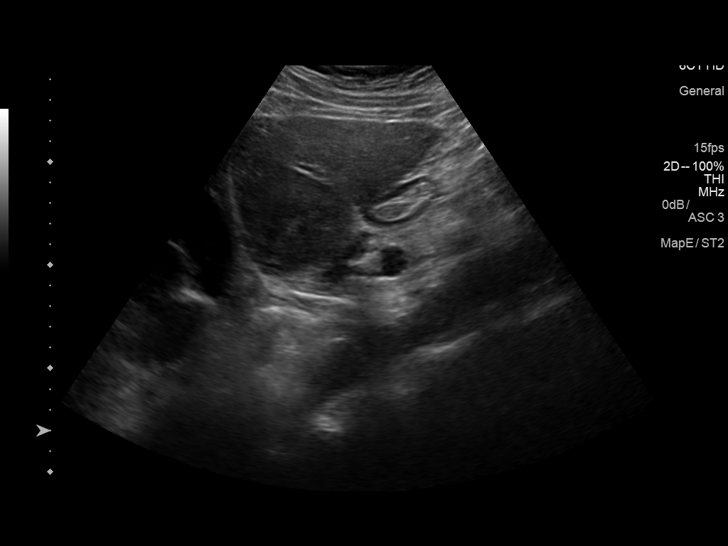
[im 3/23]
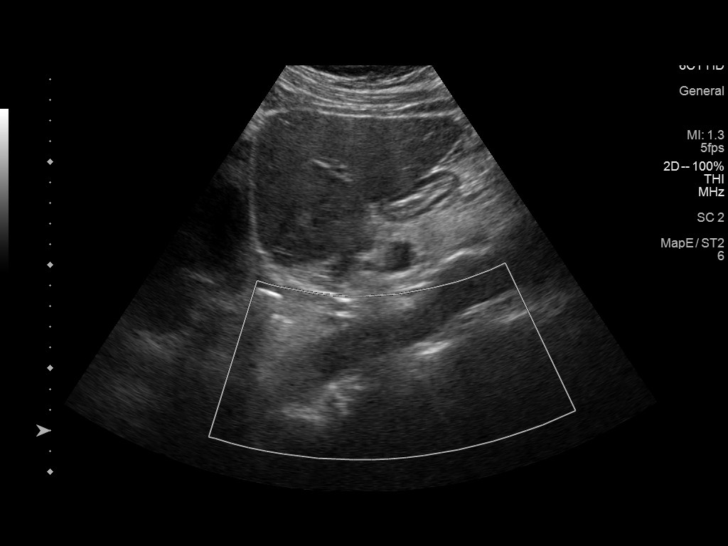
[im 5/23]
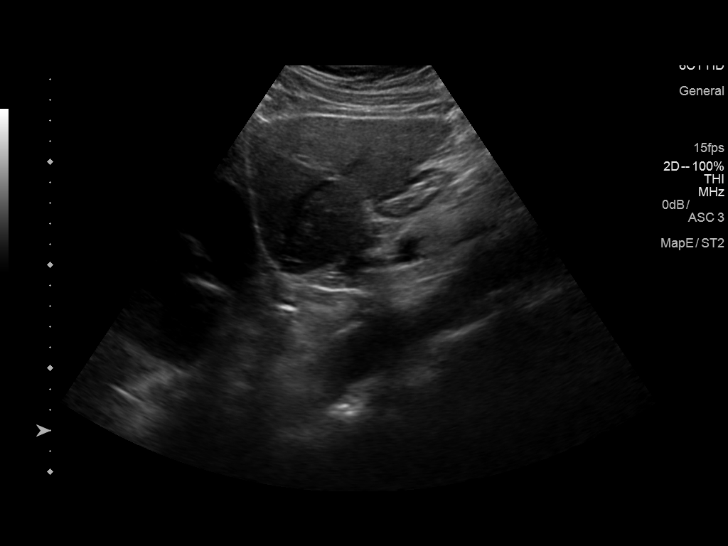
[im 6/23]
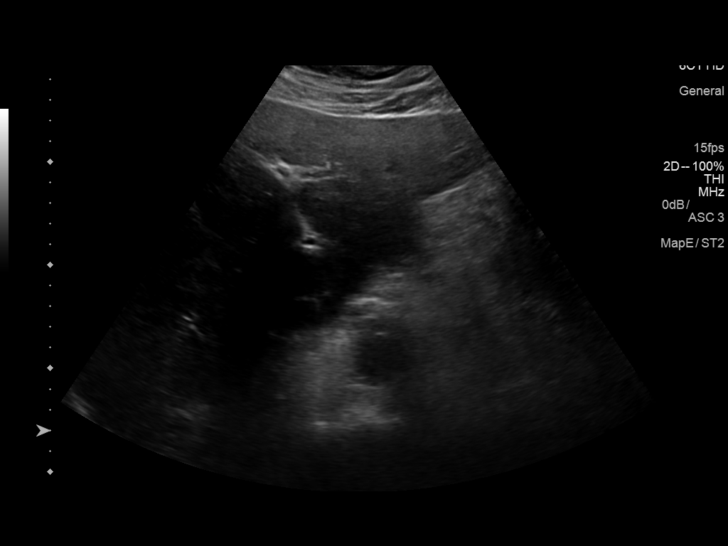
[im 8/23]
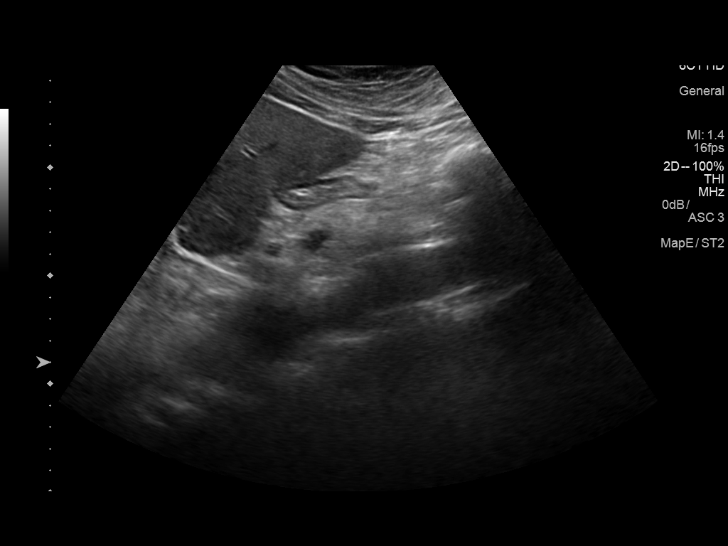
[im 10/23]
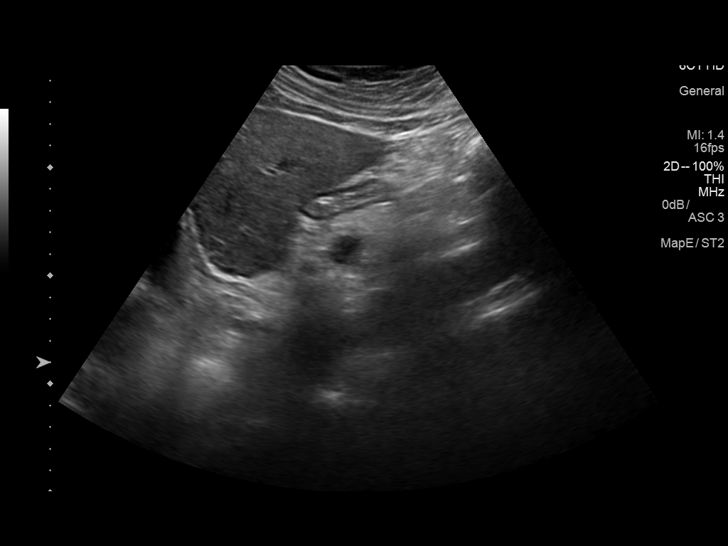
[im 11/23]
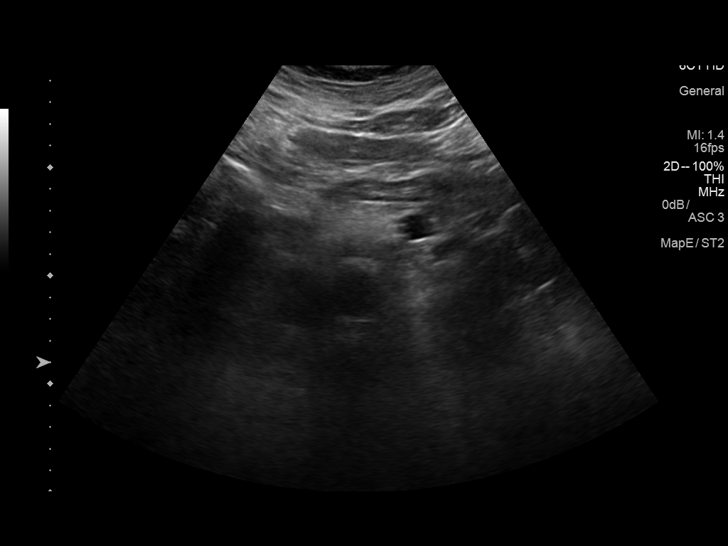
[im 13/23]
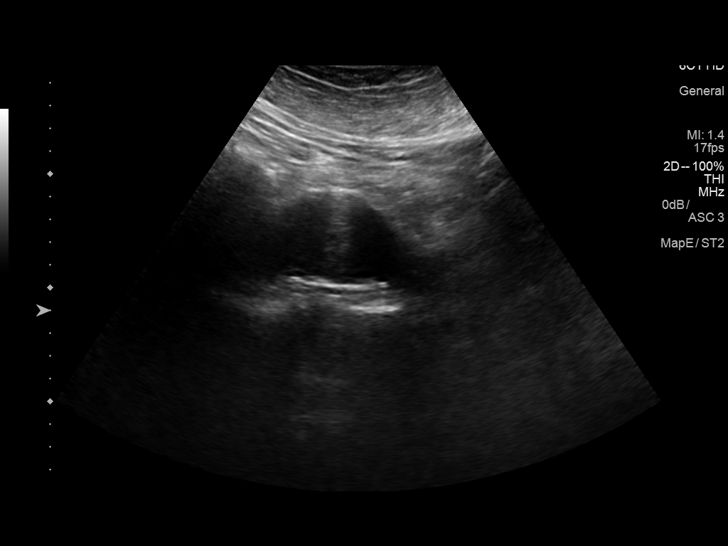
[im 14/23]
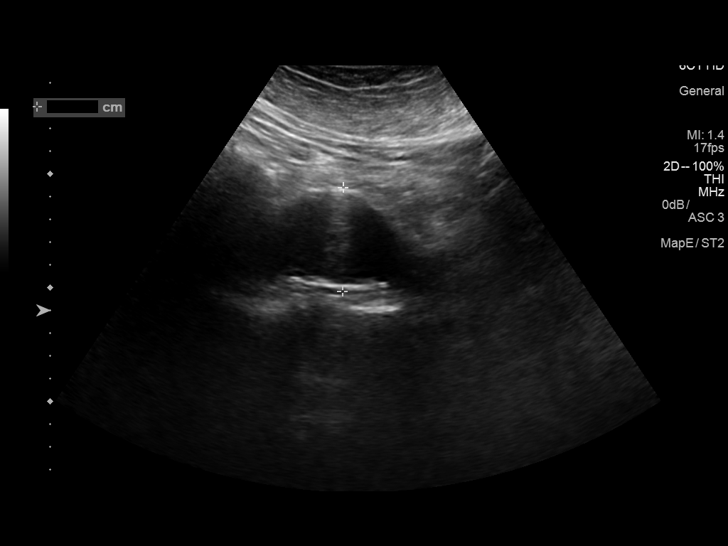
[im 16/23]
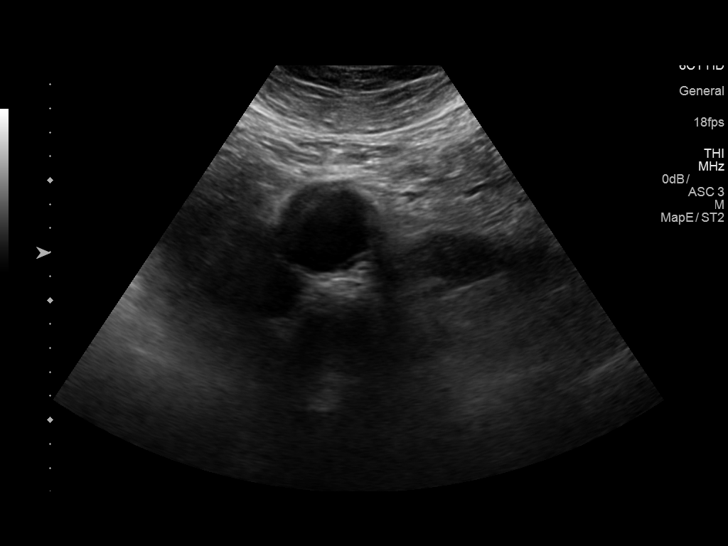
[im 18/23]
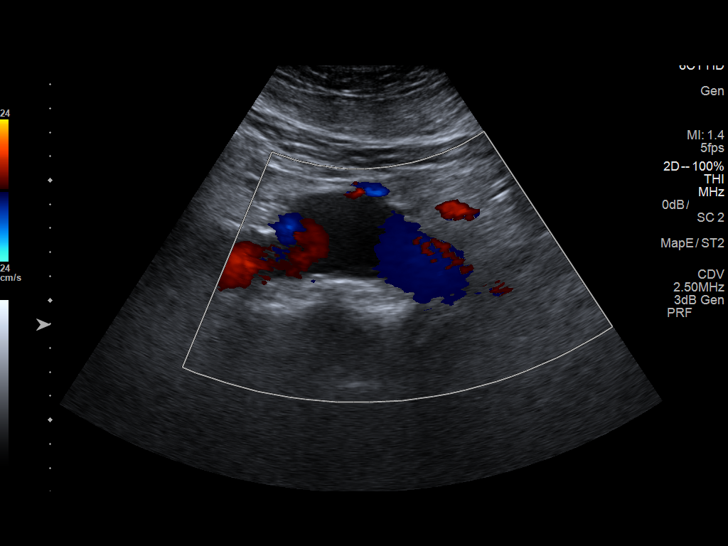
[im 19/23]
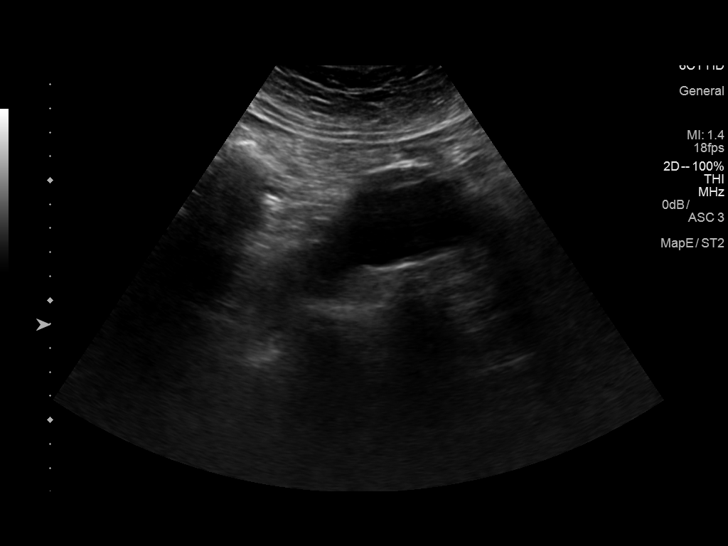
[im 21/23]
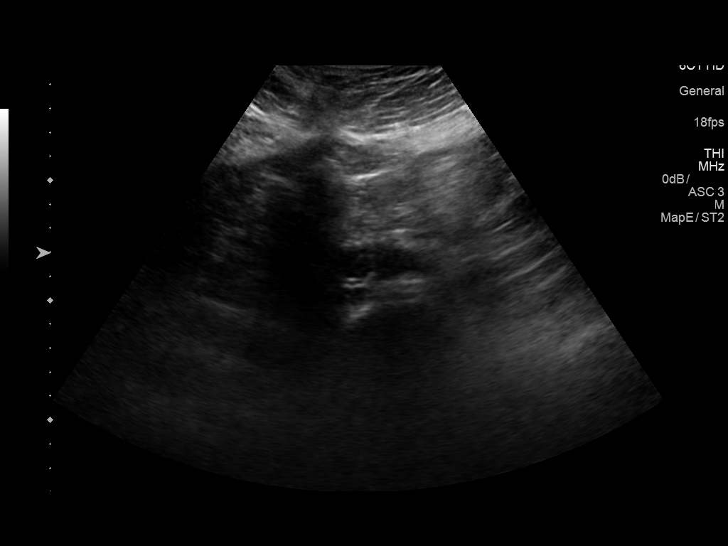
[im 23/23]
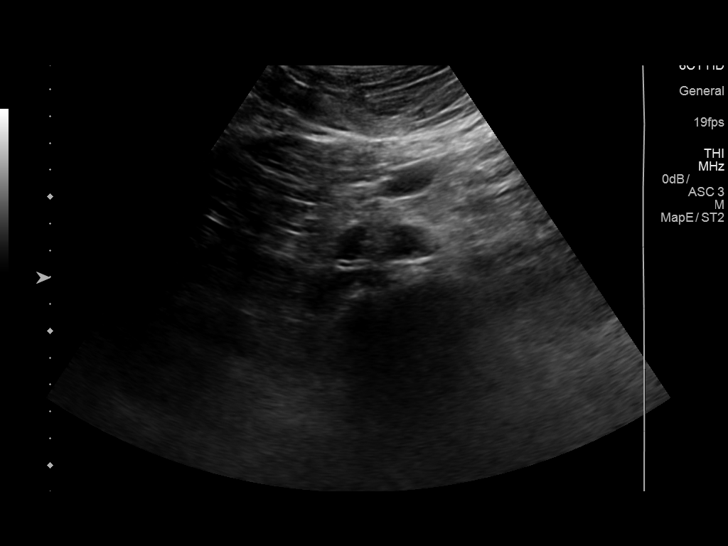

[14 of 23 positions shown; findings below may reference images not displayed]

FINDINGS: Abdominal aortic measurements as follows:

Proximal:  2.7 cm cm

Mid:  3.0 cm cm

Distal:  4.6 cm cm
IMPRESSION: 4.6 cm abdominal aortic aneurysm distally. It is fusiform in nature.
Recommend followup by abdomen and pelvis CTA in 6 months, and
vascular surgery referral/consultation if not already obtained. This
recommendation follows ACR consensus guidelines: White Paper of the
ACR Incidental Findings Committee II on Vascular Findings. [HOSPITAL] 3883; [DATE].

## 2018-07-10 ENCOUNTER — Other Ambulatory Visit: Payer: Self-pay | Admitting: Family Medicine

## 2018-07-10 DIAGNOSIS — E785 Hyperlipidemia, unspecified: Secondary | ICD-10-CM

## 2018-07-10 DIAGNOSIS — I10 Essential (primary) hypertension: Secondary | ICD-10-CM

## 2018-07-10 MED FILL — ?SIMVASTATIN 20MG TABLETS: 20 | 30 days supply | Qty: 30 | Fill #0

## 2018-07-10 MED FILL — AMLODIPINE BESYLATE 10 MG T: 10 | 30 days supply | Qty: 30 | Fill #0

## 2018-07-10 MED FILL — ?CLONIDINE HCL 0.1 MG TABL: 0.1 | 30 days supply | Qty: 30 | Fill #3

## 2018-07-10 MED FILL — HYDROCHLOROTHIAZIDE 25 MG T: 25 | 30 days supply | Qty: 30 | Fill #0

## 2018-09-04 ENCOUNTER — Other Ambulatory Visit: Payer: Self-pay

## 2018-09-04 ENCOUNTER — Encounter: Payer: Self-pay | Admitting: Vascular Surgery

## 2018-09-04 ENCOUNTER — Ambulatory Visit (HOSPITAL_COMMUNITY)
Admission: RE | Admit: 2018-09-04 | Discharge: 2018-09-04 | Disposition: A | Discharge status: Home / Self Care | Payer: Self-pay | Source: Ambulatory Visit | Attending: Vascular Surgery | Admitting: Vascular Surgery

## 2018-09-04 ENCOUNTER — Ambulatory Visit (INDEPENDENT_AMBULATORY_CARE_PROVIDER_SITE_OTHER): Payer: Self-pay | Admitting: Vascular Surgery

## 2018-09-04 VITALS — BP 141/94 | HR 73 | Temp 97.0°F | Resp 18 | Ht 71.0 in | Wt 279.0 lb

## 2018-09-04 DIAGNOSIS — I714 Abdominal aortic aneurysm, without rupture, unspecified: Secondary | ICD-10-CM

## 2018-09-04 NOTE — Progress Notes (Signed)
Patient name: Cameron Armstrong MRN: [id number] DOB: October 29, 1956 Sex: male  REASON FOR VISIT:   Follow-up of abdominal aortic aneurysm.  HPI:   Cameron Armstrong is a pleasant 62 y.o. male who I last saw on 08/29/2017 with a 4.6 cm infrarenal abdominal aortic aneurysm.  Duplex at that time showed a 4.6 cm infrarenal abdominal aortic aneurysm.  It was 3.4 and 2013.  Thus it and only change 1.2 cm in 5 years.  I explained that we would normally consider elective repair at 5.5 cm.  We discussed the importance of blood pressure control and I had a long discussion with him about the importance of tobacco cessation.  He comes in for a one-year follow-up visit.  Since I saw him last, he denies any abdominal pain or back pain.  He does continue to smoke.  He states that he is on and off smoking.  He also ran out of his blood pressure medicines about 4 to 5 days ago and is scheduled to get these refilled today.  He also tells me that he has gained about 50 pounds because of inactivity.  He denies any claudication or rest pain.  Past Medical History:  Diagnosis Date  . Abdominal aneurysm (HCC)   . Emphysema of lung (HCC)   . Hypertension   . Migraine-cluster headache syndrome   . Pulmonary nodule     Family History  Problem Relation Age of Onset  . Heart attack Father     SOCIAL HISTORY: Social History   Tobacco Use  . Smoking status: Current Every Day Smoker    Packs/day: 1.50    Years: 37.00    Pack years: 55.50    Types: Cigarettes, Cigars  . Smokeless tobacco: Never Used  Substance Use Topics  . Alcohol use: No    No Known Allergies  Current Outpatient Medications  Medication Sig Dispense Refill  . amLODipine (NORVASC) 10 MG tablet TAKE 1 TABLET BY MOUTH DAILY. 30 tablet 5  . aspirin EC 81 MG tablet Take 1 tablet (81 mg total) by mouth daily. 30 tablet 11  . cloNIDine (CATAPRES) 0.1 MG tablet TAKE 1 TABLET BY MOUTH DAILY. 30 tablet 5  . hydrochlorothiazide (HYDRODIURIL) 25 MG  tablet TAKE 1 TABLET BY MOUTH DAILY. 30 tablet 5  . sildenafil (VIAGRA) 25 MG tablet Take 1 tablet (25 mg total) by mouth daily as needed for erectile dysfunction. 30 tablet 3  . simvastatin (ZOCOR) 20 MG tablet TAKE 1 TABLET BY MOUTH DAILY. 30 tablet 5  . varenicline (CHANTIX CONTINUING MONTH PAK) 1 MG tablet Take 1 tablet (1 mg total) by mouth 2 (two) times daily. (Patient not taking: Reported on 09/04/2018) 60 tablet 1   No current facility-administered medications for this visit.     REVIEW OF SYSTEMS:  [X]  denotes positive finding, [ ]  denotes negative finding Cardiac  Comments:  Chest pain or chest pressure:    Shortness of breath upon exertion:    Short of breath when lying flat:    Irregular heart rhythm:        Vascular    Pain in calf, thigh, or hip brought on by ambulation:    Pain in feet at night that wakes you up from your sleep:     Blood clot in your veins:    Leg swelling:         Pulmonary    Oxygen at home:    Productive cough:     Wheezing:  Neurologic    Sudden weakness in arms or legs:     Sudden numbness in arms or legs:     Sudden onset of difficulty speaking or slurred speech:    Temporary loss of vision in one eye:     Problems with dizziness:         Gastrointestinal    Blood in stool:     Vomited blood:         Genitourinary    Burning when urinating:     Blood in urine:        Psychiatric    Major depression:         Hematologic    Bleeding problems:    Problems with blood clotting too easily:        Skin    Rashes or ulcers:        Constitutional    Fever or chills:     PHYSICAL EXAM:   Vitals:   09/04/18 0913 09/04/18 0917  BP: (!) 148/95 (!) 141/94  Pulse: 70 73  Resp: 18   Temp: (!) 97 F (36.1 C)   TempSrc: Oral   SpO2: 96%   Weight: 279 lb (126.6 kg)   Height: 5\' 11"  (1.803 m)    Body mass index is 38.91 kg/m.   GENERAL: The patient is a well-nourished male, in no acute distress. The vital signs are  documented above. CARDIAC: There is a regular rate and rhythm.  VASCULAR: I do not detect carotid bruits. He has palpable femoral and pedal pulses bilaterally. He has mild bilateral lower extremity swelling. PULMONARY: There is good air exchange bilaterally without wheezing or rales. ABDOMEN: Soft and non-tender with normal pitched bowel sounds.  Because of his size his aneurysm is not palpable. MUSCULOSKELETAL: There are no major deformities or cyanosis. NEUROLOGIC: No focal weakness or paresthesias are detected. SKIN: There are no ulcers or rashes noted. PSYCHIATRIC: The patient has a normal affect.  DATA:    DUPLEX ABDOMINAL AORTA: I have independently interpreted his duplex of his abdominal aorta.  The maximum diameter of his aneurysm is now 5 cm.  The right common iliac artery measures 1.0 cm in maximum diameter.  The left common iliac artery measures 1.4 cm in maximum diameter.  MEDICAL ISSUES:   5 CM INFRARENAL ABDOMINAL AORTIC ANEURYSM: His aneurysm is enlarged from 4.6 cm to 5 cm in 1 year.  He continues to smoke and we have again discussed the importance of tobacco cessation.  In addition he ran out of his blood pressure medicine.  I have explained to him that the 2 things associated with increased risk of aneurysm expansion and rupture or smoking and poorly controlled blood pressure.  I encouraged him to get off the cigarettes completely and to be sure to stay on his blood pressure medicine.  In addition, he has gained 50 pounds and his BMI is 39.  This would certainly increase his risk of surgery if the aneurysm continues to enlarge.  We have discussed the importance of exercise and nutrition today.  I have ordered a follow-up ultrasound in 6 months and I will see him back at that time.  If the aneurysm continues to enlarge that I think he will need a CT angiogram for his next follow-up to determine if he is an endovascular candidate.  Given his weight I think he would be at  significantly increased risk for open repair.  I will see him back in 6 months.  He knows  to call sooner if he has problems.   Waverly Ferrari [facility] [phone]

## 2018-09-04 NOTE — Progress Notes (Signed)
Vitals:   09/04/18 0913  BP: (!) 148/95  Pulse: 70  Resp: 18  Temp: (!) 97 F (36.1 C)  TempSrc: Oral  SpO2: 96%  Weight: 279 lb (126.6 kg)  Height: 5\' 11"  (1.803 m)

## 2018-09-05 MED FILL — AMLODIPINE BESYLATE 10 MG T: 10 | 30 days supply | Qty: 30 | Fill #1

## 2018-09-05 MED FILL — ?SIMVASTATIN 20MG TABLETS: 20 | 30 days supply | Qty: 30 | Fill #1

## 2018-09-05 MED FILL — ?CLONIDINE HCL 0.1 MG TABL: 0.1 | 30 days supply | Qty: 30 | Fill #4

## 2018-09-05 MED FILL — HYDROCHLOROTHIAZIDE 25 MG T: 25 | 30 days supply | Qty: 30 | Fill #1

## 2018-10-22 MED FILL — HYDROCHLOROTHIAZIDE 25 MG T: 25 | 30 days supply | Qty: 30 | Fill #2

## 2018-10-22 MED FILL — cloNIDine HCL 0.1 MG TABS: 0.1 | 30 days supply | Qty: 30 | Fill #5

## 2018-10-22 MED FILL — SIMVASTATIN 20 MG TABLET: 20 | 30 days supply | Qty: 30 | Fill #2

## 2018-10-22 MED FILL — AMLODIPINE BESYLATE 10 MG T: 10 | 30 days supply | Qty: 30 | Fill #2

## 2019-03-10 ENCOUNTER — Other Ambulatory Visit: Payer: Self-pay

## 2019-03-10 DIAGNOSIS — I714 Abdominal aortic aneurysm, without rupture, unspecified: Secondary | ICD-10-CM

## 2019-03-15 ENCOUNTER — Emergency Department (HOSPITAL_COMMUNITY)
Admission: EM | Admit: 2019-03-15 | Discharge: 2019-03-15 | Disposition: A | Discharge status: Home / Self Care | Payer: Self-pay | Attending: Emergency Medicine | Admitting: Emergency Medicine

## 2019-03-15 ENCOUNTER — Emergency Department (HOSPITAL_COMMUNITY): Payer: Self-pay

## 2019-03-15 ENCOUNTER — Encounter (HOSPITAL_COMMUNITY): Payer: Self-pay | Admitting: Emergency Medicine

## 2019-03-15 ENCOUNTER — Other Ambulatory Visit: Payer: Self-pay

## 2019-03-15 DIAGNOSIS — IMO0001 Reserved for inherently not codable concepts without codable children: Secondary | ICD-10-CM

## 2019-03-15 DIAGNOSIS — R911 Solitary pulmonary nodule: Secondary | ICD-10-CM | POA: Insufficient documentation

## 2019-03-15 DIAGNOSIS — F17210 Nicotine dependence, cigarettes, uncomplicated: Secondary | ICD-10-CM | POA: Insufficient documentation

## 2019-03-15 DIAGNOSIS — I10 Essential (primary) hypertension: Secondary | ICD-10-CM | POA: Insufficient documentation

## 2019-03-15 DIAGNOSIS — R319 Hematuria, unspecified: Secondary | ICD-10-CM | POA: Insufficient documentation

## 2019-03-15 LAB — URINALYSIS, ROUTINE W REFLEX MICROSCOPIC
Bilirubin Urine: NEGATIVE
Glucose, UA: NEGATIVE mg/dL
Hgb urine dipstick: NEGATIVE
Ketones, ur: NEGATIVE mg/dL
Leukocytes,Ua: NEGATIVE
Nitrite: NEGATIVE
Protein, ur: NEGATIVE mg/dL
Specific Gravity, Urine: 1.021 (ref 1.005–1.030)
pH: 5 (ref 5.0–8.0)

## 2019-03-15 LAB — CBC WITH DIFFERENTIAL/PLATELET
Abs Immature Granulocytes: 0.03 10*3/uL (ref 0.00–0.07)
Basophils Absolute: 0.1 10*3/uL (ref 0.0–0.1)
Basophils Relative: 1 %
Eosinophils Absolute: 0.2 10*3/uL (ref 0.0–0.5)
Eosinophils Relative: 2 %
HCT: 53.5 % — ABNORMAL HIGH (ref 39.0–52.0)
Hemoglobin: 17.8 g/dL — ABNORMAL HIGH (ref 13.0–17.0)
Immature Granulocytes: 0 %
Lymphocytes Relative: 23 %
Lymphs Abs: 2.5 10*3/uL (ref 0.7–4.0)
MCH: 31.3 pg (ref 26.0–34.0)
MCHC: 33.3 g/dL (ref 30.0–36.0)
MCV: 94 fL (ref 80.0–100.0)
Monocytes Absolute: 1.4 10*3/uL — ABNORMAL HIGH (ref 0.1–1.0)
Monocytes Relative: 13 %
Neutro Abs: 6.7 10*3/uL (ref 1.7–7.7)
Neutrophils Relative %: 61 %
Platelets: 208 10*3/uL (ref 150–400)
RBC: 5.69 MIL/uL (ref 4.22–5.81)
RDW: 14.2 % (ref 11.5–15.5)
WBC: 10.9 10*3/uL — ABNORMAL HIGH (ref 4.0–10.5)
nRBC: 0 % (ref 0.0–0.2)

## 2019-03-15 LAB — COMPREHENSIVE METABOLIC PANEL
ALT: 19 U/L (ref 0–44)
AST: 21 U/L (ref 15–41)
Albumin: 3.6 g/dL (ref 3.5–5.0)
Alkaline Phosphatase: 86 U/L (ref 38–126)
Anion gap: 9 (ref 5–15)
BUN: 29 mg/dL — ABNORMAL HIGH (ref 6–20)
CO2: 21 mmol/L — ABNORMAL LOW (ref 22–32)
Calcium: 8.8 mg/dL — ABNORMAL LOW (ref 8.9–10.3)
Chloride: 110 mmol/L (ref 98–111)
Creatinine, Ser: 0.88 mg/dL (ref 0.61–1.24)
GFR calc Af Amer: >60 mL/min (ref >60)
GFR calc non Af Amer: >60 mL/min (ref >60)
Glucose, Bld: 104 mg/dL — ABNORMAL HIGH (ref 70–99)
Potassium: 4 mmol/L (ref 3.5–5.1)
Sodium: 140 mmol/L (ref 135–145)
Total Bilirubin: 0.8 mg/dL (ref 0.3–1.2)
Total Protein: 7.2 g/dL (ref 6.5–8.1)

## 2019-03-15 NOTE — ED Provider Notes (Signed)
Emergency Department Provider Note   I have reviewed the triage vital signs and the nursing notes.   HISTORY  Chief Complaint Hematuria   HPI Cameron Armstrong is a 63 y.o. male with PMH of AAA, emphysema, tobacco use, and migraine HA presents to the emergency department for evaluation of hematuria.  Patient denies having pain with this episode.  He states that 2 days ago he developed gross hematuria and thought he may be passing clots.  He states that he was having headaches earlier in the week which are typical of his migraines and was taking aspirin and Tylenol but was not taking more than directed.  He is not on anticoagulation.  He did not have urinary retention symptoms.  No fevers or chills.  No dysuria.  The blood in the urine has stopped but the patient became increasingly concerned guarding possible cancer.  He states he is following with a doctor at [facility] regarding his blood work as well as a Physiological scientistvascular surgeon regarding his known AAA.  No active symptoms in the last 2 days.    Past Medical History:  Diagnosis Date  . Abdominal aneurysm (HCC)   . Emphysema of lung (HCC)   . Hypertension   . Migraine-cluster headache syndrome   . Pulmonary nodule     Patient Active Problem List   Diagnosis Date Noted  . Essential hypertension 02/18/2016  . Hyperlipidemia 02/18/2016  . Tobacco dependence 02/18/2016  . Erectile dysfunction 02/18/2016  . Cough 06/21/2013  . Smoker 06/21/2013  . HBP (high blood pressure) 06/21/2013  . Abdominal aneurysm [facility](HCC)     Past Surgical History:  Procedure Laterality Date  . HERNIA REPAIR    . TONSILLECTOMY      Allergies Patient has no known allergies.  Family History  Problem Relation Age of Onset  . Heart attack Father     Social History Social History   Tobacco Use  . Smoking status: Current Every Day Smoker    Packs/day: 1.50    Years: 37.00    Pack years: 55.50    Types: Cigarettes, Cigars  . Smokeless tobacco:  Never Used  Substance Use Topics  . Alcohol use: No  . Drug use: No    Review of Systems  Constitutional: No fever/chills Eyes: No visual changes. ENT: No sore throat. Cardiovascular: Denies chest pain. Respiratory: Denies shortness of breath. Gastrointestinal: No abdominal pain.  No nausea, no vomiting.  No diarrhea.  No constipation. Genitourinary: Negative for dysuria. Painless hematuria.  Musculoskeletal: Negative for back pain. Skin: Negative for rash. Neurological: Negative for headaches, focal weakness or numbness.  10-point ROS otherwise negative.  ____________________________________________   PHYSICAL EXAM:  VITAL SIGNS: ED Triage Vitals  Enc Vitals Group     BP 03/15/19 2055 (!) 144/106     Pulse Rate 03/15/19 2055 (!) 109     Resp 03/15/19 2055 18     Temp 03/15/19 2055 97.9 F (36.6 C)     Temp Source 03/15/19 2055 Oral     SpO2 03/15/19 2055 95 %     Pain Score 03/15/19 2056 0   Constitutional: Alert and oriented. Well appearing and in no acute distress. Eyes: Conjunctivae are normal.  Head: Atraumatic. Nose: No congestion/rhinnorhea. Mouth/Throat: Mucous membranes are moist.  Neck: No stridor.   Cardiovascular: Normal rate, regular rhythm. Good peripheral circulation. Grossly normal heart sounds.   Respiratory: Normal respiratory effort.  No retractions. Lungs CTAB. Gastrointestinal: Soft and nontender. No distention.  Musculoskeletal: No  lower extremity tenderness nor edema. No gross deformities of extremities. Neurologic:  Normal speech and language. No gross focal neurologic deficits are appreciated.  Skin:  Skin is warm, dry and intact. No rash noted.   ____________________________________________   LABS (all labs ordered are listed, but only abnormal results are displayed)  Labs Reviewed  COMPREHENSIVE METABOLIC PANEL - Abnormal; Notable for the following components:      Result Value   CO2 21 (*)    Glucose, Bld 104 (*)    BUN 29 (*)     Calcium 8.8 (*)    All other components within normal limits  CBC WITH DIFFERENTIAL/PLATELET - Abnormal; Notable for the following components:   WBC 10.9 (*)    Hemoglobin 17.8 (*)    HCT 53.5 (*)    Monocytes Absolute 1.4 (*)    All other components within normal limits  URINE CULTURE  URINALYSIS, ROUTINE W REFLEX MICROSCOPIC   ____________________________________________  RADIOLOGY  Ct Renal Stone Study  Result Date: 03/15/2019 CLINICAL DATA:  Hematuria. EXAM: CT ABDOMEN AND PELVIS WITHOUT CONTRAST TECHNIQUE: Multidetector CT imaging of the abdomen and pelvis was performed following the standard protocol without IV contrast. COMPARISON:  CT scan of May 08, 2012. FINDINGS: Lower chest: 7 mm subpleural nodule is noted in right middle lobe. No other abnormality seen involving visualized lung bases. Hepatobiliary: No focal liver abnormality is seen. No gallstones, gallbladder wall thickening, or biliary dilatation. Pancreas: Unremarkable. No pancreatic ductal dilatation or surrounding inflammatory changes. Spleen: Normal in size without focal abnormality. Adrenals/Urinary Tract: Adrenal glands are unremarkable. Kidneys are normal, without renal calculi, focal lesion, or hydronephrosis. Bladder is unremarkable. Stomach/Bowel: Stomach is within normal limits. Appendix appears normal. No evidence of bowel wall thickening, distention, or inflammatory changes. Vascular/Lymphatic: 4.8 cm infrarenal abdominal aortic aneurysm is noted. No adenopathy is noted. Reproductive: Prostate is unremarkable. Other: No abdominal wall hernia or abnormality. No abdominopelvic ascites. Musculoskeletal: No acute or significant osseous findings. IMPRESSION: 4.8 cm infrarenal abdominal aortic aneurysm. Recommend followup by abdomen and pelvis CTA in 6 months, and vascular surgery referral/consultation if not already obtained. This recommendation follows ACR consensus guidelines: White Paper of the ACR Incidental Findings  Committee II on Vascular Findings. J Am Coll Radiol 2013; 10:789-794. 7 mm subpleural nodule is noted in right middle lobe. Non-contrast chest CT at 6-12 months is recommended. If the nodule is stable at time of repeat CT, then future CT at 18-24 months (from today's scan) is considered optional for low-risk patients, but is recommended for high-risk patients. This recommendation follows the consensus statement: Guidelines for Management of Incidental Pulmonary Nodules Detected on CT Images: From the Fleischner Society 2017; Radiology 2017; 284:228-243. No acute abnormality seen in the abdomen or pelvis. Aortic Atherosclerosis (ICD10-I70.0). Electronically Signed   By: Lupita Raider, M.D.   On: 03/15/2019 22:46    ____________________________________________   PROCEDURES  Procedure(s) performed:   Procedures  None  ____________________________________________   INITIAL IMPRESSION / ASSESSMENT AND PLAN / ED COURSE  Pertinent labs & imaging results that were available during my care of the patient were reviewed by me and considered in my medical decision making (see chart for details).   Patient presents to the emergency department for evaluation of hematuria which has since resolved.  No symptoms in 2 days.  CT renal with no acute findings or clear reason for the hematuria.  No blood on UA.  No infection.  Screening labs obtained with normal platelets and RBCs.  Provided contact  information for outpatient urology should symptoms return.  Patient is a longtime smoker.  Discussed to the pulmonary nodule found incidentally on CT.  He will discuss with his PCP for follow-up imaging.  Patient already has follow-up regarding his AAA. He is attempting to quit smoking.   ____________________________________________  FINAL CLINICAL IMPRESSION(S) / ED DIAGNOSES  Final diagnoses:  Hematuria, unspecified type  Lung nodule < 6cm on CT    Note:  This document was prepared using Dragon voice  recognition software and may include unintentional dictation errors.  Alona Bene, MD Emergency Medicine    Long, Arlyss Repress, MD 03/15/19 [phone]

## 2019-03-15 NOTE — ED Triage Notes (Signed)
Patient here from home with complaints of blood in urine on Thursday. Reports none today. Concerned and "would like to be checked". Denies blood thinners. Denies difficulty urinating.

## 2019-03-15 NOTE — ED Notes (Signed)
Urine culture sent to the lab. 

## 2019-03-15 NOTE — ED Notes (Signed)
Clear, yellow urine specimen obtained. Urine and culture sent to lab.

## 2019-03-15 NOTE — Discharge Instructions (Signed)
You do not have evidence of blood in the urine at this time. Call the Urologist if symptoms return. You do have lung nodule on CT. Make your PCP aware of this at your next visit and they can arrange follow up imaging. You also have the aortic aneurysm. Continue to follow up with your Vascular Surgeon as instructed.

## 2019-03-19 ENCOUNTER — Ambulatory Visit (HOSPITAL_COMMUNITY): Payer: Self-pay

## 2019-03-19 ENCOUNTER — Ambulatory Visit: Payer: Self-pay | Admitting: Vascular Surgery

## 2019-04-01 NOTE — Telephone Encounter (Signed)
Message sent to provider 

## 2019-10-23 ENCOUNTER — Other Ambulatory Visit: Payer: Self-pay

## 2019-10-23 DIAGNOSIS — I714 Abdominal aortic aneurysm, without rupture, unspecified: Secondary | ICD-10-CM

## 2019-10-29 ENCOUNTER — Other Ambulatory Visit: Payer: Self-pay

## 2019-10-29 ENCOUNTER — Encounter: Payer: Self-pay | Admitting: Vascular Surgery

## 2019-10-29 ENCOUNTER — Ambulatory Visit (INDEPENDENT_AMBULATORY_CARE_PROVIDER_SITE_OTHER): Payer: Self-pay | Admitting: Vascular Surgery

## 2019-10-29 ENCOUNTER — Ambulatory Visit (HOSPITAL_COMMUNITY)
Admission: RE | Admit: 2019-10-29 | Discharge: 2019-10-29 | Disposition: A | Discharge status: Home / Self Care | Payer: Self-pay | Source: Ambulatory Visit | Attending: Vascular Surgery | Admitting: Vascular Surgery

## 2019-10-29 VITALS — BP 130/97 | HR 86 | Temp 98.0°F | Resp 20 | Ht 71.0 in | Wt 279.0 lb

## 2019-10-29 DIAGNOSIS — I714 Abdominal aortic aneurysm, without rupture, unspecified: Secondary | ICD-10-CM

## 2019-10-29 NOTE — Progress Notes (Signed)
Patient name: Cameron Armstrong MRN: [id number] DOB: 10/19/1956 Sex: male  REASON FOR VISIT:   Follow-up of abdominal aortic aneurysm.  HPI:   Cameron Armstrong is a pleasant 63 y.o. male who I am following with an abdominal aortic aneurysm.  I last saw him a year ago on 10/05/2018.  At that time the maximum diameter of his aneurysm was 4.9.  This had enlarged from 4.6.  He was continuing to smoke we discussed importance of tobacco cessation.  In addition he had gained some weight.  Comes in for routine follow-up visit.  He does continue to smoke, 1-1/2 packs/day.  There have been no other significant changes in his medical history.  He states that he has continued to gain some weight.  He denies any abdominal pain or back pain.  Past Medical History:  Diagnosis Date  . Abdominal aneurysm (HCC)   . Emphysema of lung (HCC)   . Hypertension   . Migraine-cluster headache syndrome   . Pulmonary nodule     Family History  Problem Relation Age of Onset  . Heart attack Father     SOCIAL HISTORY: Social History   Tobacco Use  . Smoking status: Current Every Day Smoker    Packs/day: 1.50    Years: 37.00    Pack years: 55.50    Types: Cigarettes, Cigars  . Smokeless tobacco: Never Used  Substance Use Topics  . Alcohol use: No    No Known Allergies  Current Outpatient Medications  Medication Sig Dispense Refill  . amLODipine (NORVASC) 10 MG tablet TAKE 1 TABLET BY MOUTH DAILY. 30 tablet 5  . aspirin EC 81 MG tablet Take 1 tablet (81 mg total) by mouth daily. 30 tablet 11  . hydrochlorothiazide (HYDRODIURIL) 25 MG tablet TAKE 1 TABLET BY MOUTH DAILY. 30 tablet 5  . simvastatin (ZOCOR) 20 MG tablet TAKE 1 TABLET BY MOUTH DAILY. 30 tablet 5   No current facility-administered medications for this visit.     REVIEW OF SYSTEMS:  [X]  denotes positive finding, [ ]  denotes negative finding Cardiac  Comments:  Chest pain or chest pressure:    Shortness of breath upon exertion: x    Short of breath when lying flat:    Irregular heart rhythm:        Vascular    Pain in calf, thigh, or hip brought on by ambulation:    Pain in feet at night that wakes you up from your sleep:     Blood clot in your veins:    Leg swelling:         Pulmonary    Oxygen at home:    Productive cough:     Wheezing:         Neurologic    Sudden weakness in arms or legs:     Sudden numbness in arms or legs:     Sudden onset of difficulty speaking or slurred speech:    Temporary loss of vision in one eye:     Problems with dizziness:         Gastrointestinal    Blood in stool:     Vomited blood:         Genitourinary    Burning when urinating:     Blood in urine:        Psychiatric    Major depression:         Hematologic    Bleeding problems:    Problems with blood clotting too easily:  Skin    Rashes or ulcers:        Constitutional    Fever or chills:     PHYSICAL EXAM:   Vitals:   10/29/19 0900  BP: (!) 130/97  Pulse: 86  Resp: 20  Temp: 98 F (36.7 C)  SpO2: 95%  Weight: 279 lb (126.6 kg)  Height: 5\' 11"  (1.803 m)   Body mass index is 38.91 kg/m.  GENERAL: The patient is a well-nourished male, in no acute distress. The vital signs are documented above. CARDIAC: There is a regular rate and rhythm.  VASCULAR: I do not detect carotid bruits. He has palpable femoral, dorsalis pedis, and posterior tibial pulses bilaterally. PULMONARY: There is good air exchange bilaterally without wheezing or rales. ABDOMEN: Soft and non-tender with normal pitched bowel sounds.  MUSCULOSKELETAL: There are no major deformities or cyanosis. NEUROLOGIC: No focal weakness or paresthesias are detected. SKIN: There are no ulcers or rashes noted. PSYCHIATRIC: The patient has a normal affect.  DATA:    DUPLEX ABDOMINAL AORTA: I have independently interpreted his duplex of his abdominal aorta.  The maximum diameter is noted to be 4.7 cm.  Visualization of the iliacs was  somewhat difficult but the iliacs were less than 2 cm bilaterally.  MEDICAL ISSUES:   4.7 CM INFRARENAL ABDOMINAL AORTIC ANEURYSM: His aneurysm remains stable in size.  Given that visualization was somewhat challenging because of overlying bowel gas today I have recommended follow-up in 9 months.  If her continuing to have problems with visualization we may need to consider a CT scan but I am trying to avoid CT scans if possible because of radiation risks and contrast risk.  I will see him back in 9 months.  We have also discussed the importance of blood pressure control.  He is on aspirin and is on a statin.  He will call sooner if there are problems.  Deitra Mayo [facility] [phone]

## 2019-11-06 ENCOUNTER — Other Ambulatory Visit: Payer: Self-pay

## 2019-11-06 DIAGNOSIS — I714 Abdominal aortic aneurysm, without rupture, unspecified: Secondary | ICD-10-CM

## 2020-04-06 ENCOUNTER — Other Ambulatory Visit: Payer: Self-pay

## 2020-04-06 ENCOUNTER — Emergency Department (HOSPITAL_COMMUNITY)
Admission: EM | Admit: 2020-04-06 | Discharge: 2020-04-06 | Disposition: A | Discharge status: Home / Self Care | Payer: Self-pay | Attending: Emergency Medicine | Admitting: Emergency Medicine

## 2020-04-06 ENCOUNTER — Encounter (HOSPITAL_COMMUNITY): Payer: Self-pay | Admitting: *Deleted

## 2020-04-06 DIAGNOSIS — I10 Essential (primary) hypertension: Secondary | ICD-10-CM | POA: Insufficient documentation

## 2020-04-06 DIAGNOSIS — Z760 Encounter for issue of repeat prescription: Secondary | ICD-10-CM | POA: Insufficient documentation

## 2020-04-06 DIAGNOSIS — Z79899 Other long term (current) drug therapy: Secondary | ICD-10-CM | POA: Insufficient documentation

## 2020-04-06 DIAGNOSIS — F17210 Nicotine dependence, cigarettes, uncomplicated: Secondary | ICD-10-CM | POA: Insufficient documentation

## 2020-04-06 MED ORDER — AMLODIPINE BESYLATE 5 MG PO TABS
10.0000 mg | ORAL_TABLET | Freq: Once | ORAL | Status: AC
  Administered 2020-04-06: 10 mg via ORAL
  Filled 2020-04-06: qty 2

## 2020-04-06 MED ORDER — ATORVASTATIN CALCIUM 40 MG PO TABS: 40.0000 mg | ORAL_TABLET | Freq: Every day | ORAL | 0 refills | Status: AC

## 2020-04-06 MED ORDER — AMLODIPINE-OLMESARTAN 10-20 MG PO TABS: 1.0000 | ORAL_TABLET | Freq: Every day | ORAL | 0 refills | Status: AC

## 2020-04-06 NOTE — ED Triage Notes (Signed)
Pt states he checked his BP at [facility] and it was elevated. Pt reporting he lost his medication on the bus about 12 days ago. He has left a message for the clinic but has not heart back. Pt denies pain.

## 2020-04-06 NOTE — ED Provider Notes (Signed)
[facility]-EMERGENCY DEPT Provider Note   CSN: [id number] Arrival date & time: 04/06/20  2120     History Chief Complaint  Patient presents with  . Medication Refill    Cameron Armstrong is a 64 y.o. male with a history of hypertension, tobacco abuse, migraines, hyperlipidemia and abdominal aneurysm who presents to the ED with request for medication refill today. He states that he takes blood pressure medication once per day and that he accidentally left the medications on the bus 12 days ago. He has not had his blood pressure medications since then, he has tried contacting the community clinic he is followed by in [place] without success. Tonight he checked his blood pressure at [facility] and noted it to be elevated 156 systolic, unsure of diastolic pressure. He overall feels okay, no alleviating/aggravating factors, denies headache, visual disturbance, numbness, weakness, chest pain, dyspnea, or abdominal pain. States he just wants a refill to ensure his BP is controlled, has follow up appointment with clinic 04/14/20.   HPI     Past Medical History:  Diagnosis Date  . Abdominal aneurysm (HCC)   . Emphysema of lung (HCC)   . Hypertension   . Migraine-cluster headache syndrome   . Pulmonary nodule     Patient Active Problem List   Diagnosis Date Noted  . Essential hypertension 02/18/2016  . Hyperlipidemia 02/18/2016  . Tobacco dependence 02/18/2016  . Erectile dysfunction 02/18/2016  . Cough 06/21/2013  . Smoker 06/21/2013  . HBP (high blood pressure) 06/21/2013  . Abdominal aneurysm [facility])     Past Surgical History:  Procedure Laterality Date  . HERNIA REPAIR    . TONSILLECTOMY         Family History  Problem Relation Age of Onset  . Heart attack Father     Social History   Tobacco Use  . Smoking status: Current Every Day Smoker    Packs/day: 1.50    Years: 37.00    Pack years: 55.50    Types: Cigarettes, Cigars  . Smokeless tobacco:  Never Used  Substance Use Topics  . Alcohol use: No  . Drug use: No    Home Medications Prior to Admission medications   Medication Sig Start Date End Date Taking? Authorizing Provider  amLODipine (NORVASC) 10 MG tablet TAKE 1 TABLET BY MOUTH DAILY. 07/10/18   Quentin Angst, MD  aspirin EC 81 MG tablet Take 1 tablet (81 mg total) by mouth daily. 06/11/17   Massie Maroon, FNP  hydrochlorothiazide (HYDRODIURIL) 25 MG tablet TAKE 1 TABLET BY MOUTH DAILY. 07/10/18   Quentin Angst, MD  simvastatin (ZOCOR) 20 MG tablet TAKE 1 TABLET BY MOUTH DAILY. 07/10/18   Quentin Angst, MD    Allergies    Patient has no known allergies.  Review of Systems   Review of Systems  Constitutional: Negative for chills and fever.  Eyes: Negative for visual disturbance.  Respiratory: Negative for cough and shortness of breath.   Cardiovascular: Negative for chest pain.  Gastrointestinal: Negative for abdominal pain, nausea and vomiting.  Neurological: Negative for seizures, syncope, facial asymmetry, speech difficulty, weakness, numbness and headaches.  All other systems reviewed and are negative.   Physical Exam Updated Vital Signs BP (!) 165/106 (BP Location: Left Arm)   Pulse 93   Temp 98 F (36.7 C) (Oral)   Resp 20   Ht 5\' 11"  (1.803 m)   Wt 122.5 kg   SpO2 92%   BMI 37.66 kg/m  Physical Exam Vitals and nursing note reviewed.  Constitutional:      General: He is not in acute distress.    Appearance: He is well-developed. He is not toxic-appearing.  HENT:     Head: Normocephalic and atraumatic.  Eyes:     General:        Right eye: No discharge.        Left eye: No discharge.     Conjunctiva/sclera: Conjunctivae normal.  Cardiovascular:     Rate and Rhythm: Normal rate and regular rhythm.     Comments: 2+ symmetric radial pulses.  Pulmonary:     Effort: Pulmonary effort is normal. No respiratory distress.     Breath sounds: Normal breath sounds. No wheezing,  rhonchi or rales.  Abdominal:     General: There is no distension.     Palpations: Abdomen is soft.     Tenderness: There is no abdominal tenderness. There is no guarding or rebound.  Musculoskeletal:     Cervical back: Neck supple.  Skin:    General: Skin is warm and dry.     Findings: No rash.  Neurological:     Mental Status: He is alert.     Comments: Clear speech. CN III-XII Grossly intact. Sensation & strength grossly intact x 4. Patient is ambulatory.   Psychiatric:        Behavior: Behavior normal.     ED Results / Procedures / Treatments   Labs (all labs ordered are listed, but only abnormal results are displayed) Labs Reviewed - No data to display  EKG EKG Interpretation  Date/Time:  Tuesday Apr 06 2020 21:46:15 EDT Ventricular Rate:  90 PR Interval:    QRS Duration: 110 QT Interval:  387 QTC Calculation: 474 R Axis:   -49 Text Interpretation: Sinus rhythm Left anterior fascicular block Abnormal R-wave progression, late transition LVH with secondary repolarization abnormality No STEMI Confirmed by Nanda Quinton [phone]) on 04/06/2020 9:51:19 PM   Radiology No results found.  Procedures Procedures (including critical care time)  Medications Ordered in ED Medications  amLODipine (NORVASC) tablet 10 mg (has no administration in time range)    ED Course  I have reviewed the triage vital signs and the nursing notes.  Pertinent labs & imaging results that were available during my care of the patient were reviewed by me and considered in my medical decision making (see chart for details).    MDM Rules/Calculators/A&P                     Patient presents to the ED with request for refill of his anti-hypertensive medications this evening as he has lost them and noted his BP to be high tonight at [employer]. He is nontoxic, resting comfortably, vitals WNL with the exception of elevated BP, based on H&P low suspicion for HTN emergency. Asymptomatic. Benign exam. EKG per  triage reviewed, no STEMI, appears fairly similar to prior. Reviewed chart for additional history- it appears he was prescribed amlodipine-olmesartan 10-20 MG per tablet daily as well as Atorvastatin 40 mg daily- I further discussed with patient, he confirms these were his prescriptions and that he lost them both. He had labs at that time with normal metabolic panel.  Will give dose of amlodipine in the ED (do not have olmesartan on hospital formulary) and discharge home with 1 month of refills of his lost medications. Follow up with PCP. I discussed results, treatment plan, need for follow-up, and return precautions with the patient.  Provided opportunity for questions, patient confirmed understanding and is in agreement with plan.   Final Clinical Impression(s) / ED Diagnoses Final diagnoses:  Medication refill  Hypertension, unspecified type    Rx / DC Orders ED Discharge Orders         Ordered    amlodipine-olmesartan (AZOR) 10-20 MG tablet  Daily     04/06/20 2235    atorvastatin (LIPITOR) 40 MG tablet  Daily     04/06/20 [address], PA-C 04/06/20 2237    Maia Plan, MD 04/07/20 1251

## 2020-04-06 NOTE — Discharge Instructions (Addendum)
You were seen in the emergency department today due to concern for high blood pressure and needing medication refill.  We have provided refills of your blood pressure medication (amlodipine-olmesartan) as well as your cholesterol medication (atorvastatin).  Please take each of these medications daily.  Please follow-up with your primary care provider within 1 week for reevaluation and recheck of your blood pressure.  Return to the ER for new or worsening symptoms including but not limited to headache, change in your vision, chest pain, trouble breathing, numbness, weakness, facial droop, slurred speech, abdominal pain, back pain, or any other concerns.

## 2020-06-04 ENCOUNTER — Telehealth: Payer: Self-pay

## 2020-06-04 NOTE — Telephone Encounter (Signed)
Call the Pt since received a VM about the [facility] program, I was checking his chart an it show last time he saw a PCP provide in [facility] was 12/26/2017 since has been more that a year he need gto be re-establish care before he can re-apply for the [facility] program, unable to LVM since his VM is full

## 2020-06-09 ENCOUNTER — Telehealth: Payer: Self-pay | Admitting: General Practice

## 2020-06-09 NOTE — Telephone Encounter (Deleted)
Copied from CRM [id number]. Topic: General - Other >> Jun 04, 2020  1:48 PM Elliot Gault wrote: Reason for CRM: patient requesting a follow up call regarding [facility] card eligibility

## 2020-06-09 NOTE — Telephone Encounter (Signed)
Pt was explain thet the [facility] program is only for resident of [facility] and he is leaving in [facility], this mean he is not qualified for it

## 2020-06-09 NOTE — Telephone Encounter (Signed)
Please advise. Copied from CRM [id number]. Topic: General - Other >> Jun 04, 2020  1:48 PM Elliot Gault wrote: Reason for CRM: patient requesting a follow up call regarding [facility] card eligibility

## 2020-06-24 ENCOUNTER — Inpatient Hospital Stay: Payer: Self-pay | Admitting: Physician Assistant

## 2020-07-28 ENCOUNTER — Ambulatory Visit: Payer: Self-pay | Admitting: Vascular Surgery

## 2021-02-07 IMAGING — CT CT RENAL STONE PROTOCOL
2 of 4 series · 16 of 46 positions shown, 18 images · non-contrast
Comparison: CT scan of May 08, 2012.

CLINICAL DATA: Hematuria.

EXAM:
CT ABDOMEN AND PELVIS WITHOUT CONTRAST
TECHNIQUE: Multidetector CT imaging of the abdomen and pelvis was performed
following the standard protocol without IV contrast.

[Series 2: axial st · axial · 0.98mm/px · z∈[-482,-56]mm · 13 of 96 slices shown, 15 images]
[im 6/96  soft-tissue]
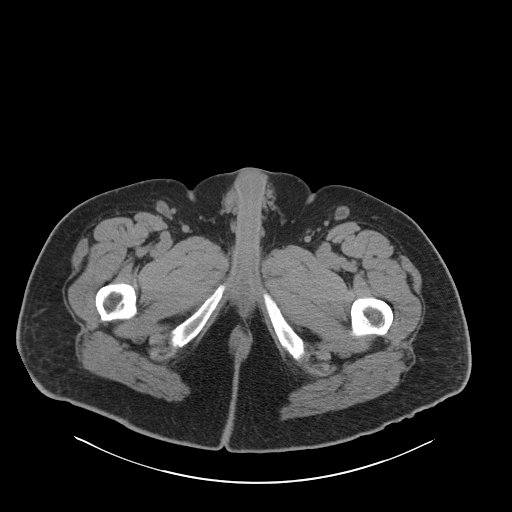
[im 6/96  bone]
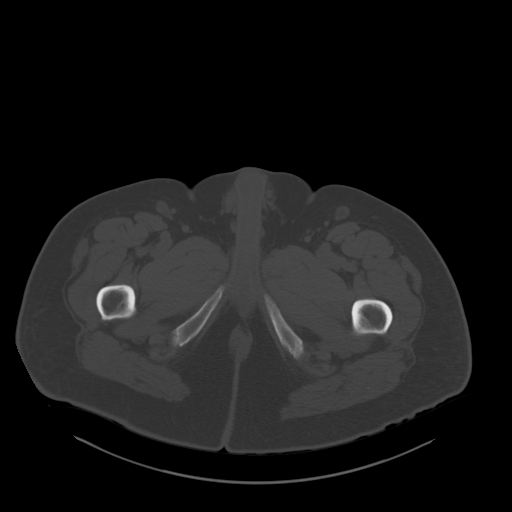
[im 16/96  soft-tissue]
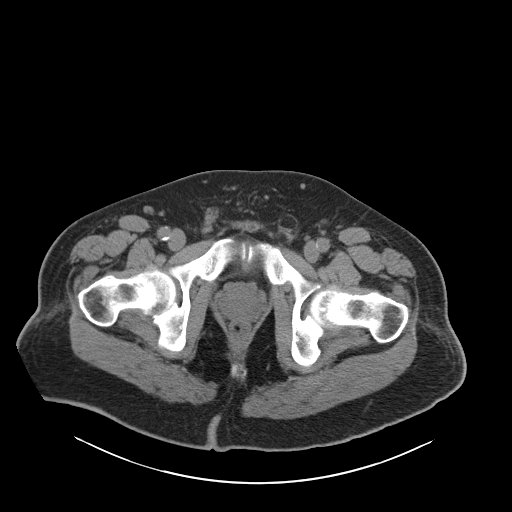
[im 21/96  soft-tissue]
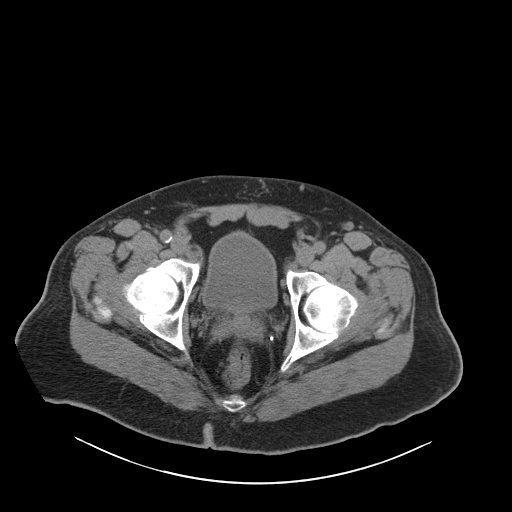
[im 26/96  soft-tissue]
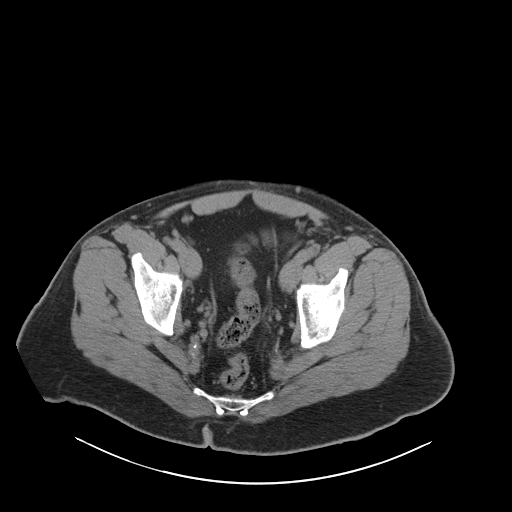
[im 36/96  soft-tissue]
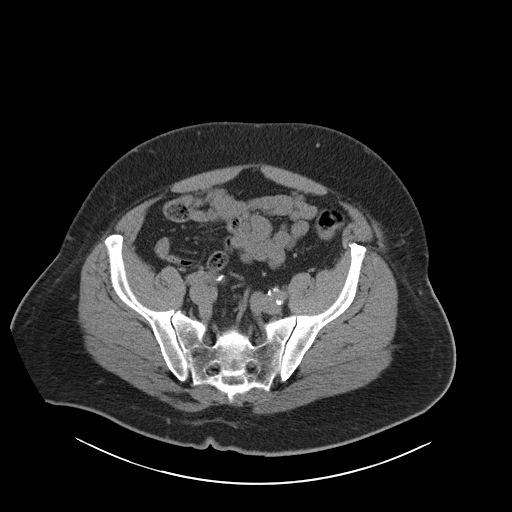
[im 41/96  soft-tissue]
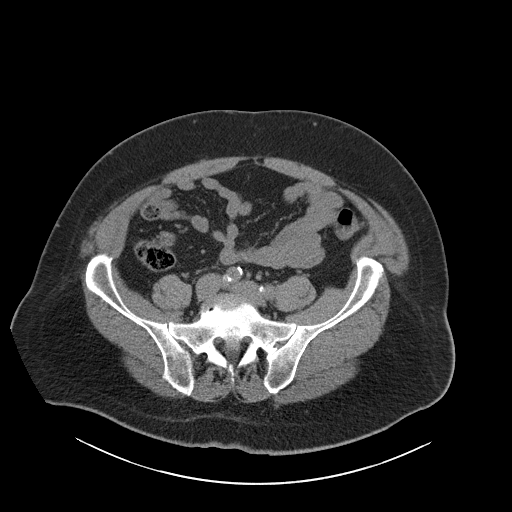
[im 51/96  soft-tissue]
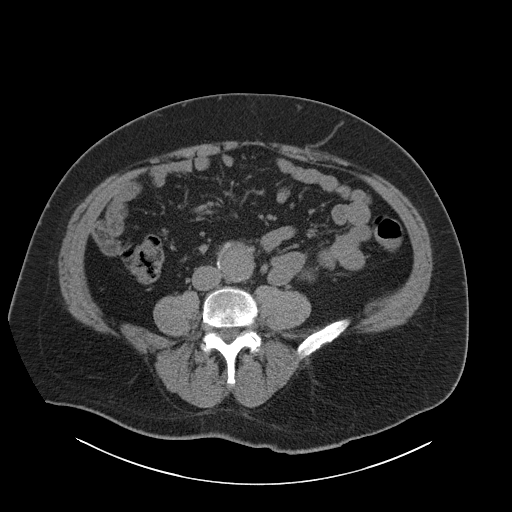
[im 56/96  soft-tissue]
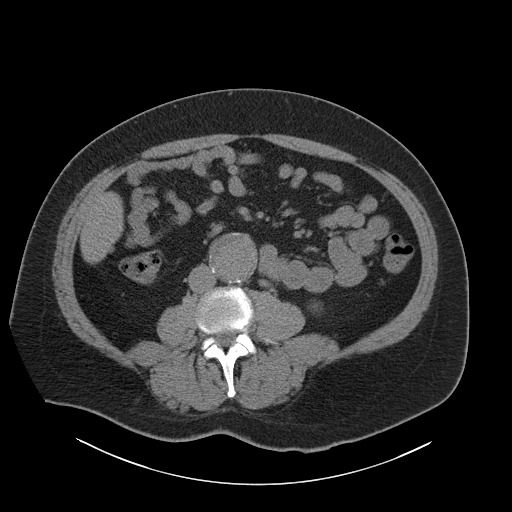
[im 61/96  soft-tissue]
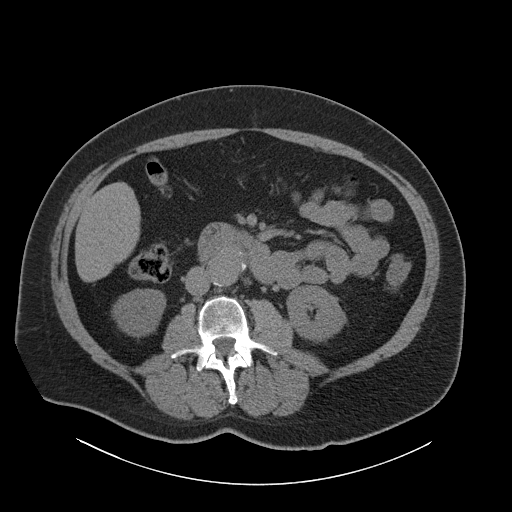
[im 61/96  bone]
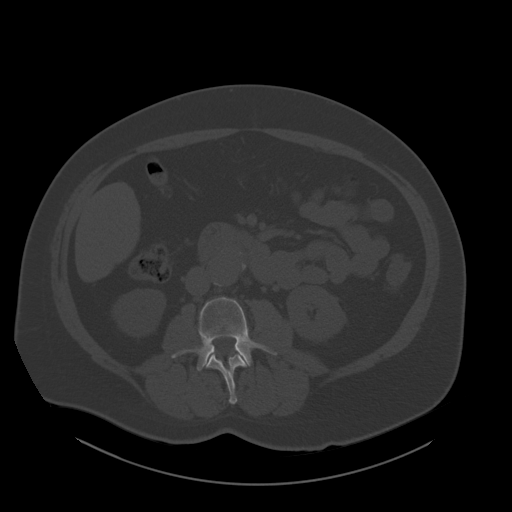
[im 71/96  soft-tissue]
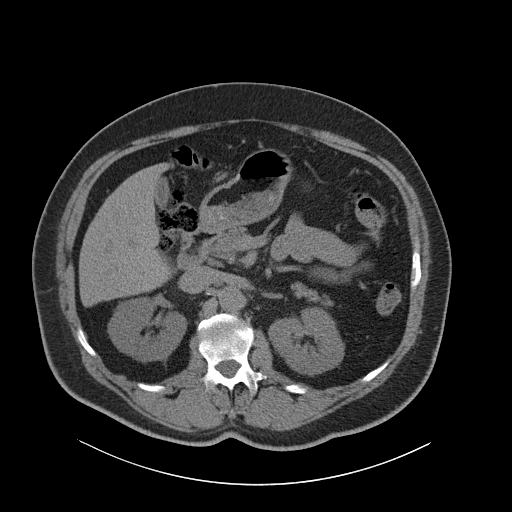
[im 76/96  soft-tissue]
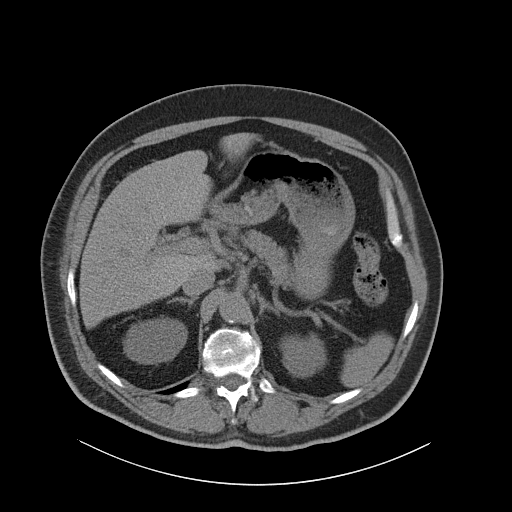
[im 81/96  soft-tissue]
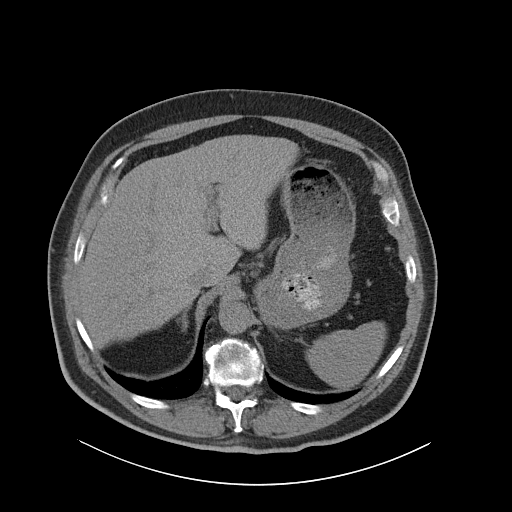
[im 91/96  soft-tissue]
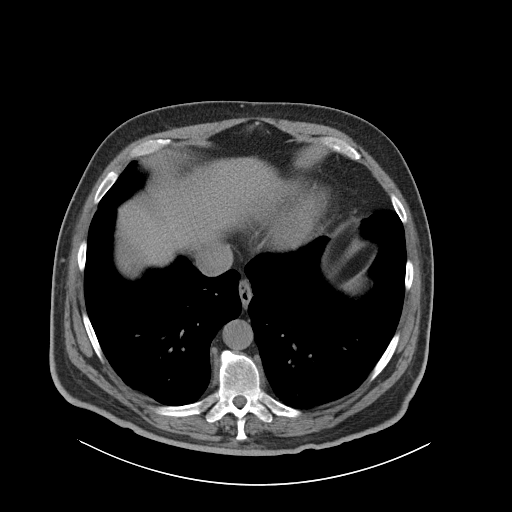

[Series 4: coronal · coronal · 0.97mm/px · 3 of 198 slices shown]
[im 66/198  soft-tissue]
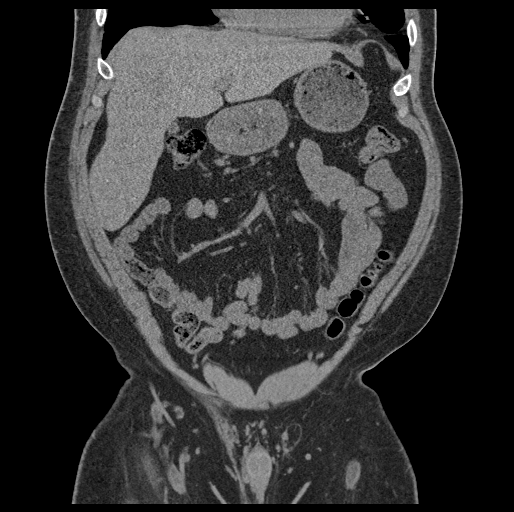
[im 88/198  soft-tissue]
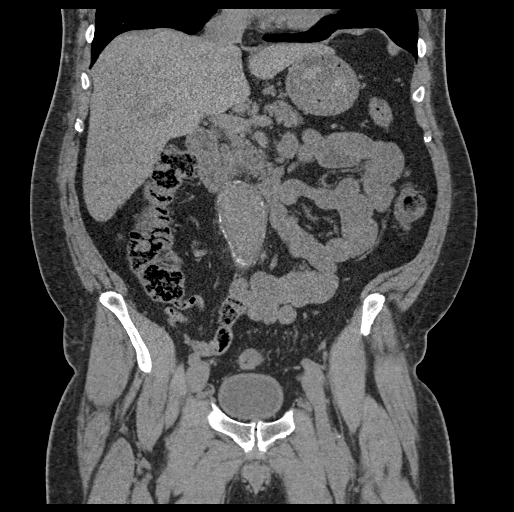
[im 110/198  soft-tissue]
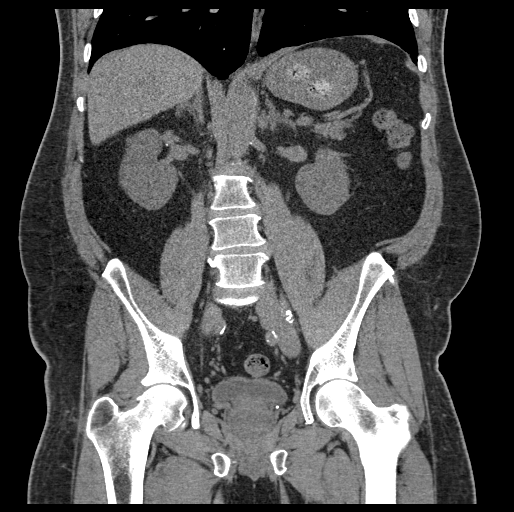

[16 of 46 positions shown; findings below may reference images not displayed]

FINDINGS: Lower chest: 7 mm subpleural nodule is noted in right middle lobe.
No other abnormality seen involving visualized lung bases.

Hepatobiliary: No focal liver abnormality is seen. No gallstones,
gallbladder wall thickening, or biliary dilatation.

Pancreas: Unremarkable. No pancreatic ductal dilatation or
surrounding inflammatory changes.

Spleen: Normal in size without focal abnormality.

Adrenals/Urinary Tract: Adrenal glands are unremarkable. Kidneys are
normal, without renal calculi, focal lesion, or hydronephrosis.
Bladder is unremarkable.

Stomach/Bowel: Stomach is within normal limits. Appendix appears
normal. No evidence of bowel wall thickening, distention, or
inflammatory changes.

Vascular/Lymphatic: 4.8 cm infrarenal abdominal aortic aneurysm is
noted. No adenopathy is noted.

Reproductive: Prostate is unremarkable.

Other: No abdominal wall hernia or abnormality. No abdominopelvic
ascites.

Musculoskeletal: No acute or significant osseous findings.
IMPRESSION: 4.8 cm infrarenal abdominal aortic aneurysm. Recommend followup by
abdomen and pelvis CTA in 6 months, and vascular surgery
referral/consultation if not already obtained. This recommendation
follows ACR consensus guidelines: White Paper of the ACR Incidental
Findings Committee II on Vascular Findings. [HOSPITAL] 6344;
[DATE].

7 mm subpleural nodule is noted in right middle lobe. Non-contrast
chest CT at 6-12 months is recommended. If the nodule is stable at
time of repeat CT, then future CT at 18-24 months (from today's
scan) is considered optional for low-risk patients, but is
recommended for high-risk patients. This recommendation follows the
consensus statement: Guidelines for Management of Incidental
Pulmonary Nodules Detected on CT Images: From the [HOSPITAL]

No acute abnormality seen in the abdomen or pelvis.

Aortic Atherosclerosis ([id number]-UYA.A).
